# Patient Record
Sex: Female | Born: 1995
Health system: Southern US, Community
[De-identification: ages and names within clinical notes are randomized; demographics above are authoritative.]

## PROBLEM LIST (undated history)

## (undated) DIAGNOSIS — D649 Anemia, unspecified: Secondary | ICD-10-CM

## (undated) DIAGNOSIS — G4730 Sleep apnea, unspecified: Secondary | ICD-10-CM

## (undated) DIAGNOSIS — G47419 Narcolepsy without cataplexy: Secondary | ICD-10-CM

## (undated) HISTORY — DX: Anemia, unspecified: D64.9

## (undated) HISTORY — DX: Sleep apnea, unspecified: G47.30

---
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
## 2017-03-22 DIAGNOSIS — G47411 Narcolepsy with cataplexy: Secondary | ICD-10-CM | POA: Diagnosis not present

## 2017-03-22 DIAGNOSIS — Z79899 Other long term (current) drug therapy: Secondary | ICD-10-CM | POA: Diagnosis not present

## 2017-03-22 MED FILL — MODAFINIL 200 MG TABLET: 200 | 90 days supply | Qty: 180 | Fill #0

## 2017-06-07 DIAGNOSIS — G47411 Narcolepsy with cataplexy: Secondary | ICD-10-CM | POA: Diagnosis not present

## 2017-06-07 DIAGNOSIS — G4719 Other hypersomnia: Secondary | ICD-10-CM | POA: Diagnosis not present

## 2017-07-25 DIAGNOSIS — G4733 Obstructive sleep apnea (adult) (pediatric): Secondary | ICD-10-CM | POA: Diagnosis not present

## 2017-07-25 DIAGNOSIS — G4719 Other hypersomnia: Secondary | ICD-10-CM | POA: Diagnosis not present

## 2017-07-25 DIAGNOSIS — G47411 Narcolepsy with cataplexy: Secondary | ICD-10-CM | POA: Diagnosis not present

## 2017-10-08 ENCOUNTER — Telehealth: Payer: Self-pay | Admitting: Family

## 2017-10-08 NOTE — Telephone Encounter (Signed)
error 

## 2017-10-11 DIAGNOSIS — G4733 Obstructive sleep apnea (adult) (pediatric): Secondary | ICD-10-CM | POA: Diagnosis not present

## 2017-10-16 ENCOUNTER — Ambulatory Visit: Payer: 59 | Admitting: Family

## 2017-10-18 ENCOUNTER — Ambulatory Visit (INDEPENDENT_AMBULATORY_CARE_PROVIDER_SITE_OTHER): Payer: 59 | Admitting: Family

## 2017-10-18 ENCOUNTER — Encounter: Payer: Self-pay | Admitting: Family

## 2017-10-18 VITALS — BP 128/69 | HR 77 | Temp 98.5°F | Resp 16 | Ht 64.5 in | Wt 182.0 lb

## 2017-10-18 DIAGNOSIS — G4733 Obstructive sleep apnea (adult) (pediatric): Secondary | ICD-10-CM | POA: Diagnosis not present

## 2017-10-18 DIAGNOSIS — Z23 Encounter for immunization: Secondary | ICD-10-CM | POA: Diagnosis not present

## 2017-10-18 DIAGNOSIS — R0789 Other chest pain: Secondary | ICD-10-CM

## 2017-10-18 DIAGNOSIS — D649 Anemia, unspecified: Secondary | ICD-10-CM | POA: Diagnosis not present

## 2017-10-18 DIAGNOSIS — G47419 Narcolepsy without cataplexy: Secondary | ICD-10-CM | POA: Diagnosis not present

## 2017-10-18 DIAGNOSIS — G47411 Narcolepsy with cataplexy: Secondary | ICD-10-CM

## 2017-10-18 DIAGNOSIS — Z309 Encounter for contraceptive management, unspecified: Secondary | ICD-10-CM

## 2017-10-18 DIAGNOSIS — N898 Other specified noninflammatory disorders of vagina: Secondary | ICD-10-CM | POA: Diagnosis not present

## 2017-10-18 DIAGNOSIS — Z0000 Encounter for general adult medical examination without abnormal findings: Secondary | ICD-10-CM

## 2017-10-18 NOTE — Patient Instructions (Addendum)
Please schedule your routine eye exam.  Complete lab work prior to leaving. Work on [employer] Grouphealthy diet, exercise and weight loss.

## 2017-10-18 NOTE — Progress Notes (Signed)
Subjective:    Patient ID: Dorothy [name], female    DOB: 08/03/1996, 21 y.o.   MRN: [id number]  HPI  [name] is a 21 yr old female who presents today to establish care.  pmhx is significant for narcolepsy and anemia.  Narcolepsy- she is followed by Dorothy [name].  Last office visit reviewed in care everywhere.  Recently diagnosed with sleep apnea last Friday and will be fitted with cpap.    Anemia- She reports that she has not been taking iron recently.   Immunizations: due for MMR2, gardisil, tdap, varicella #2, meningococcal, flu Diet: reports tha she has been working on her diet Exercise: reports that she exercises 2 x a week generally.  Dental: up to date Vision:  Due  Wt Readings from Last 3 Encounters:  10/18/17 182 lb (82.6 kg) (95 %, Z= 1.67)*  09/09/14 142 lb (64.4 kg) (82 %, Z= 0.93)*  07/16/12 134 lb 12.8 oz (61.1 kg) (86 %, Z= 1.06)*   * Growth percentiles are based on CDC (Girls, 2-20 Years) data.   Reports that she has been having sensation of "pressure on my chest."  Reports that this is intermittent and it usually happens at rest.    Reports that she sometimes gets vaginal cysts.  They can be painful at times.  Believes she has 1 now.  Declines pelvic exam due to menses today.  Wishes to review with GYN.  Review of Systems  Constitutional: Negative for unexpected weight change.  HENT: Negative for rhinorrhea.   Respiratory: Negative for cough.   Cardiovascular:       Mom reports some LE swelling  Gastrointestinal: Negative for diarrhea.       Occasional mild constipation  Genitourinary: Positive for frequency. Negative for dysuria and menstrual problem.  Musculoskeletal: Negative for arthralgias and myalgias.  Skin:       Mild eczema bilateral hands  Neurological: Negative for headaches.  Hematological: Negative for adenopathy.  Psychiatric/Behavioral:       Denies depression/anxiety   Past Medical History:  Diagnosis Date  . Anemia     . Narcolepsy      Social History   Socioeconomic History  . Marital status: Single    Spouse name: Not on file  . Number of children: Not on file  . Years of education: Not on file  . Highest education level: Not on file  Social Needs  . Financial resource strain: Not on file  . Food insecurity - worry: Not on file  . Food insecurity - inability: Not on file  . Transportation needs - medical: Not on file  . Transportation needs - non-medical: Not on file  Occupational History  . Not on file  Tobacco Use  . Smoking status: Never Smoker  . Smokeless tobacco: Never Used  Substance and Sexual Activity  . Alcohol use: No  . Drug use: No  . Sexual activity: No  Other Topics Concern  . Not on file  Social History Narrative  . Not on file    History reviewed. No pertinent surgical history.  Family History  Problem Relation Age of Onset  . Learning disabilities Mother   . Diabetes Father   . Rheum arthritis Sister   . Diabetes Maternal Grandmother   . Stroke Maternal Grandmother   . Heart disease Maternal Grandfather   . Diabetes Paternal Grandmother   . Cancer Paternal Grandfather     No Known Allergies  Current Outpatient Medications on File Prior  to Visit  Medication Sig Dispense Refill  . modafinil (PROVIGIL) 200 MG tablet 1 tab by mouth in the AM and 1/2 to 1 tab by mouth at noon    . FLUOXETINE HCL PO Take by mouth.     No current facility-administered medications on file prior to visit.     BP 128/69 (BP Location: Right Arm, Patient Position: Sitting, Cuff Size: Small)   Pulse 77   Temp 98.5 F (36.9 C) (Oral)   Resp 16   Ht 5' 4.5" (1.638 m)   Wt 182 lb (82.6 kg)   LMP 09/18/2017   SpO2 100%   BMI 30.76 kg/m       Objective:   Physical Exam  Physical Exam  Constitutional: She is oriented to person, place, and time. She appears well-developed and well-nourished. No distress.  HENT:  Head: Normocephalic and atraumatic.  Right Ear: Tympanic  membrane and ear canal normal.  Left Ear: Tympanic membrane and ear canal normal.  Mouth/Throat: Oropharynx is clear and moist.  Eyes: Pupils are equal, round, and reactive to light. No scleral icterus.  Neck: Normal range of motion. No thyromegaly present.  Cardiovascular: Normal rate and regular rhythm.   No murmur heard. Pulmonary/Chest: Effort normal and breath sounds normal. No respiratory distress. He has no wheezes. She has no rales. She exhibits no tenderness.  Abdominal: Soft. Bowel sounds are normal. She exhibits no distension and no mass. There is no tenderness. There is no rebound and no guarding.  Musculoskeletal: She exhibits no edema.  Lymphadenopathy:    She has no cervical adenopathy.  Neurological: She is alert and oriented to person, place, and time. She has normal patellar reflexes. She exhibits normal muscle tone. Coordination normal.  Skin: Skin is warm and dry.  Psychiatric: She has a normal mood and affect. Her behavior is normal. Judgment and thought content normal.  Breast/pelvic: deferred to GYN          Assessment & Plan:  Preventative care-discussed healthy diet, exercise and weight loss.  Multiple vaccines were updated as  ordered.  When she returns for her second Gardasil vaccine will begin hepatitis A series as well.  Contraceptive management-she is maintained on modafinil.  Since this can interact with hormonal contraceptives will refer her to GYN to discuss alternatives.  In the meantime I have advised her to use condoms consistently.    Obstructive sleep apnea-this is a recent diagnosis. Also has narcolepsy.   She is in the process of getting CPAP arranged from her sleep specialist.  Anemia- check follow up CBC.    Vaginal lesions- defer to GYN at pt request.   Atypical chest pain- heart exam is normal. EKG tracing is personally reviewed.  EKG notes NSR.  No acute changes.  Could be related to her OSA. Will see how she does after she gets her cpap  started.            Assessment & Plan:

## 2017-10-19 ENCOUNTER — Encounter: Payer: Self-pay | Admitting: Family

## 2017-10-23 ENCOUNTER — Telehealth: Payer: Self-pay | Admitting: Family

## 2017-10-23 NOTE — Telephone Encounter (Signed)
It appears that she did not complete her lab work at the end of her visit.  Could you please contact the patient and ask her to schedule lab draw?

## 2017-10-23 NOTE — Telephone Encounter (Signed)
Pt lab apt has been scheduled.

## 2017-10-23 NOTE — Telephone Encounter (Signed)
Mychart message sent.

## 2017-10-24 ENCOUNTER — Other Ambulatory Visit (INDEPENDENT_AMBULATORY_CARE_PROVIDER_SITE_OTHER): Payer: 59

## 2017-10-24 DIAGNOSIS — Z0000 Encounter for general adult medical examination without abnormal findings: Secondary | ICD-10-CM

## 2017-10-24 LAB — URINALYSIS, ROUTINE W REFLEX MICROSCOPIC
Bilirubin Urine: NEGATIVE
Ketones, ur: NEGATIVE
Leukocytes, UA: NEGATIVE
Nitrite: NEGATIVE
SPECIFIC GRAVITY, URINE: 1.025 (ref 1.000–1.030)
Total Protein, Urine: NEGATIVE
Urine Glucose: NEGATIVE
Urobilinogen, UA: 0.2 (ref 0.0–1.0)
WBC, UA: NONE SEEN (ref 0–?)
pH: 6 (ref 5.0–8.0)

## 2017-10-24 LAB — CBC WITH DIFFERENTIAL/PLATELET
BASOS ABS: 0 10*3/uL (ref 0.0–0.1)
Basophils Relative: 0.3 % (ref 0.0–3.0)
EOS ABS: 0.1 10*3/uL (ref 0.0–0.7)
Eosinophils Relative: 2 % (ref 0.0–5.0)
HCT: 35.3 % — ABNORMAL LOW (ref 36.0–49.0)
HEMOGLOBIN: 11.3 g/dL — AB (ref 12.0–16.0)
LYMPHS ABS: 2.3 10*3/uL (ref 0.7–4.0)
Lymphocytes Relative: 45.5 % (ref 24.0–48.0)
MCHC: 32 g/dL (ref 31.0–37.0)
MCV: 88.8 fl (ref 78.0–98.0)
Monocytes Absolute: 0.3 10*3/uL (ref 0.1–1.0)
Monocytes Relative: 6.8 % (ref 3.0–12.0)
Neutro Abs: 2.3 10*3/uL (ref 1.4–7.7)
Neutrophils Relative %: 45.4 % (ref 43.0–71.0)
Platelets: 225 10*3/uL (ref 150.0–575.0)
RBC: 3.98 Mil/uL (ref 3.80–5.70)
RDW: 15.2 % (ref 11.4–15.5)
WBC: 5 10*3/uL (ref 4.5–13.5)

## 2017-10-24 LAB — BASIC METABOLIC PANEL
BUN: 11 mg/dL (ref 6–23)
CALCIUM: 9.6 mg/dL (ref 8.4–10.5)
CO2: 29 mEq/L (ref 19–32)
CREATININE: 0.74 mg/dL (ref 0.40–1.20)
Chloride: 102 mEq/L (ref 96–112)
GFR: 107.4 mL/min (ref >60.00)
Glucose, Bld: 88 mg/dL (ref 70–99)
POTASSIUM: 4.2 meq/L (ref 3.5–5.1)
Sodium: 140 mEq/L (ref 135–145)

## 2017-10-24 LAB — HEPATIC FUNCTION PANEL
ALT: 18 U/L (ref 0–35)
AST: 23 U/L (ref 0–37)
Albumin: 4.4 g/dL (ref 3.5–5.2)
Alkaline Phosphatase: 116 U/L (ref 47–119)
BILIRUBIN TOTAL: 0.4 mg/dL (ref 0.2–1.2)
Bilirubin, Direct: 0.1 mg/dL (ref 0.0–0.3)
TOTAL PROTEIN: 7.9 g/dL (ref 6.0–8.3)

## 2017-10-24 LAB — TSH: TSH: 2.21 u[IU]/mL (ref 0.40–5.00)

## 2017-10-24 LAB — LIPID PANEL
Cholesterol: 164 mg/dL (ref 0–200)
HDL: 49.3 mg/dL (ref >39.00)
LDL CALC: 100 mg/dL — AB (ref 0–99)
NonHDL: 114.6
TRIGLYCERIDES: 73 mg/dL (ref 0.0–149.0)
Total CHOL/HDL Ratio: 3
VLDL: 14.6 mg/dL (ref 0.0–40.0)

## 2017-10-25 ENCOUNTER — Other Ambulatory Visit: Payer: Self-pay | Admitting: Family

## 2017-10-25 MED ORDER — MULTI-VITAMIN/MINERALS PO TABS: 1.0000 | ORAL_TABLET | Freq: Every day | ORAL | Status: DC

## 2017-10-25 NOTE — Telephone Encounter (Signed)
Lab work shows mild anemia. Begin MVI with minerals. Some blood in urine but I think that she had her period that day- can you confirm please?

## 2017-10-28 NOTE — Telephone Encounter (Signed)
Last LMP documented 09/18/2017.

## 2017-11-01 ENCOUNTER — Encounter: Payer: Self-pay | Admitting: Obstetrics & Gynecology

## 2017-11-01 ENCOUNTER — Ambulatory Visit (INDEPENDENT_AMBULATORY_CARE_PROVIDER_SITE_OTHER): Payer: 59 | Admitting: Obstetrics & Gynecology

## 2017-11-01 VITALS — BP 126/59 | HR 87 | Ht 64.0 in | Wt 183.0 lb

## 2017-11-01 DIAGNOSIS — Z23 Encounter for immunization: Secondary | ICD-10-CM

## 2017-11-01 DIAGNOSIS — N946 Dysmenorrhea, unspecified: Secondary | ICD-10-CM | POA: Insufficient documentation

## 2017-11-01 DIAGNOSIS — D649 Anemia, unspecified: Secondary | ICD-10-CM | POA: Insufficient documentation

## 2017-11-01 DIAGNOSIS — Z0000 Encounter for general adult medical examination without abnormal findings: Secondary | ICD-10-CM | POA: Diagnosis not present

## 2017-11-01 DIAGNOSIS — D500 Iron deficiency anemia secondary to blood loss (chronic): Secondary | ICD-10-CM | POA: Diagnosis not present

## 2017-11-01 MED ORDER — NORGESTREL-ETHINYL ESTRADIOL 0.3-30 MG-MCG PO TABS: 1.0000 | ORAL_TABLET | Freq: Every day | ORAL | 11 refills | Status: DC

## 2017-11-01 MED FILL — ELINEST-28 TABLET: 0.3-30 | 84 days supply | Qty: 84 | Fill #0

## 2017-11-01 NOTE — Addendum Note (Signed)
Addended by: Anell BarrHOWARD, Magnolia Mattila L on: 11/01/2017 11:25 AM   Modules accepted: Orders

## 2017-11-01 NOTE — Progress Notes (Signed)
Patient ID: Dorothy [name], female   DOB: 11/24/1995, 21 y.o.   MRN: [id number]  No chief complaint on file.   HPI Dorothy [name] is a 21 y.o. female.single G0. Here today to discuss contraception, at her mother's request.She had coitarche at 21 yo. She has only had 1 partner. They had sex last sometime this past Spring. She is not currently sexually active. Her periods last 6-7 days, some cramping- takes IBU 400 mg prn.  She takes meds for narcolepsy.   HPI  Past Medical History:  Diagnosis Date  . Anemia   . Narcolepsy     History reviewed. No pertinent surgical history.  Family History  Problem Relation Age of Onset  . Learning disabilities Mother   . Diabetes Father   . Rheum arthritis Sister   . Diabetes Maternal Grandmother   . Stroke Maternal Grandmother   . Heart disease Maternal Grandfather   . Diabetes Paternal Grandmother   . Cancer Paternal Grandfather     Social History Social History   Tobacco Use  . Smoking status: Never Smoker  . Smokeless tobacco: Never Used  Substance Use Topics  . Alcohol use: No  . Drug use: No    No Known Allergies  Current Outpatient Medications  Medication Sig Dispense Refill  . modafinil (PROVIGIL) 200 MG tablet 1 tab by mouth in the AM and 1/2 to 1 tab by mouth at noon    . Multiple Vitamins-Minerals (MULTIVITAMIN WITH MINERALS) tablet Take 1 tablet by mouth daily.     No current facility-administered medications for this visit.     Review of Systems Review of Systems Graduated from high school, [facility] [employer] [facility]. She has had a flu vaccine this season.  Blood pressure (!) 126/59, pulse 87, height 5\' 4"  (1.626 m), weight 183 lb (83 kg), last menstrual period 10/18/2017.  Physical Exam Physical Exam Breathing, conversing, and ambulating normally Well nourished, well hydrated Black female, no apparent distress Abd- benign  Data Reviewed Results for Dorothy [name], Dorothy [name] (MRN [id number]) as of  11/01/2017 10:29  Ref. Range 10/24/2017 08:26  WBC Latest Ref Range: 4.5 - 13.5 K/uL 5.0  RBC Latest Ref Range: 3.80 - 5.70 Mil/uL 3.98  Hemoglobin Latest Ref Range: 12.0 - 16.0 g/dL 08.611.3 (L)  HCT Latest Ref Range: 36.0 - 49.0 % 35.3 (L)  MCV Latest Ref Range: 78.0 - 98.0 fl 88.8  MCHC Latest Ref Range: 31.0 - 37.0 g/dL 57.832.0  RDW Latest Ref Range: 11.4 - 15.5 % 15.2  Platelets Latest Ref Range: 150.0 - 575.0 K/uL 225.0    Assessment    Dysmenorrhea and low grade anemia-  lo ovral prescribed. With her Provigil, the efficacy for contraception may be decreased. She will use condoms EVERY SINGLE SEX ENCOUNTER!  Check STI testing today    Gardasil #1 today  Plan    See above She declines IUD, depo provera, Nexplanon Come back 2 months for Gardasil #2       Allie BossierMyra C Prakash Kimberling 11/01/2017, 10:20 AM

## 2017-11-02 LAB — RPR: RPR: NONREACTIVE

## 2017-11-02 LAB — HEPATITIS B SURFACE ANTIGEN: HEP B S AG: NEGATIVE

## 2017-11-02 LAB — HEPATITIS C ANTIBODY: Hep C Virus Ab: <0.1 s/co ratio (ref 0.0–0.9)

## 2017-11-02 LAB — HIV ANTIBODY (ROUTINE TESTING W REFLEX): HIV Screen 4th Generation wRfx: NONREACTIVE

## 2017-11-03 ENCOUNTER — Encounter: Payer: Self-pay | Admitting: Obstetrics & Gynecology

## 2017-11-07 ENCOUNTER — Other Ambulatory Visit: Payer: Self-pay

## 2017-11-07 DIAGNOSIS — G4733 Obstructive sleep apnea (adult) (pediatric): Secondary | ICD-10-CM | POA: Diagnosis not present

## 2017-11-07 MED ORDER — NORGESTREL-ETHINYL ESTRADIOL 0.3-30 MG-MCG PO TABS: 1.0000 | ORAL_TABLET | Freq: Every day | ORAL | 11 refills | Status: DC

## 2017-11-07 NOTE — Telephone Encounter (Signed)
Patient changing pharmacies.Armandina StammerJennifer Howard RNBSN

## 2017-11-18 MED FILL — MODAFINIL 200 MG TABLET: 200 | 90 days supply | Qty: 180 | Fill #0

## 2017-11-22 ENCOUNTER — Ambulatory Visit: Payer: 59

## 2017-11-27 ENCOUNTER — Encounter: Payer: Self-pay | Admitting: Obstetrics & Gynecology

## 2017-12-02 ENCOUNTER — Encounter: Payer: Self-pay | Admitting: Obstetrics & Gynecology

## 2017-12-08 DIAGNOSIS — G4733 Obstructive sleep apnea (adult) (pediatric): Secondary | ICD-10-CM | POA: Diagnosis not present

## 2017-12-09 DIAGNOSIS — G4733 Obstructive sleep apnea (adult) (pediatric): Secondary | ICD-10-CM | POA: Diagnosis not present

## 2017-12-16 ENCOUNTER — Encounter: Payer: Self-pay | Admitting: Obstetrics & Gynecology

## 2017-12-23 ENCOUNTER — Ambulatory Visit: Payer: 59 | Admitting: Obstetrics & Gynecology

## 2018-01-03 ENCOUNTER — Other Ambulatory Visit: Payer: 59

## 2018-01-05 DIAGNOSIS — G4733 Obstructive sleep apnea (adult) (pediatric): Secondary | ICD-10-CM | POA: Diagnosis not present

## 2018-01-08 ENCOUNTER — Ambulatory Visit (INDEPENDENT_AMBULATORY_CARE_PROVIDER_SITE_OTHER): Payer: 59 | Admitting: Obstetrics & Gynecology

## 2018-01-08 ENCOUNTER — Encounter: Payer: Self-pay | Admitting: Obstetrics & Gynecology

## 2018-01-08 VITALS — BP 118/65 | HR 89 | Ht 64.0 in | Wt 172.0 lb

## 2018-01-08 DIAGNOSIS — N946 Dysmenorrhea, unspecified: Secondary | ICD-10-CM

## 2018-01-08 LAB — CBC
HCT: 34.3 % — ABNORMAL LOW (ref 35.0–45.0)
Hemoglobin: 11.3 g/dL — ABNORMAL LOW (ref 11.7–15.5)
MCH: 28 pg (ref 27.0–33.0)
MCHC: 32.9 g/dL (ref 32.0–36.0)
MCV: 84.9 fL (ref 80.0–100.0)
MPV: 11.2 fL (ref 7.5–12.5)
Platelets: 279 10*3/uL (ref 140–400)
RBC: 4.04 10*6/uL (ref 3.80–5.10)
RDW: 13.3 % (ref 11.0–15.0)
WBC: 4.5 10*3/uL (ref 3.8–10.8)

## 2018-01-08 NOTE — Progress Notes (Signed)
   Subjective:    Patient ID: Dorothy LeveringKahli D Capp, female    DOB: 04/02/1996, 22 y.o.   MRN: [id number]  HPI 22 yo single G0 here to discuss her new start of Lo ovral. She has had 1 full period and bled every day for a month, heavy sometimes heav   Review of Systems She has now had 2 Gardasil shots    Objective:   Physical Exam  Breathing, conversing, and ambulating normally Well nourished, well hydrated Black female, no apparent distress Abd- benign TSH- normal 12/18     Assessment & Plan:  Anemia- recheck hemoglobin Check for Von Willebrands Come back 3 months for Gardasil #3

## 2018-01-09 LAB — VON WILLEBRAND PANEL

## 2018-01-10 ENCOUNTER — Telehealth: Payer: Self-pay

## 2018-01-10 DIAGNOSIS — Z9989 Dependence on other enabling machines and devices: Secondary | ICD-10-CM | POA: Diagnosis not present

## 2018-01-10 DIAGNOSIS — G4733 Obstructive sleep apnea (adult) (pediatric): Secondary | ICD-10-CM | POA: Diagnosis not present

## 2018-01-10 NOTE — Telephone Encounter (Signed)
We got notification that pt's Von Willebrand Panel was not resulted by [facility] due to the specimen not being frozen when it was picked up. Called pt to see if she can come back to office to have it redrawn and she put her mother on the phone. Pt's mother spoke with me and stated that pt was going back to school and could not come back to the office other than today after 12:00 but, our office closed at 12:00. We printed off an order for pt to pick up and recommended she go to [facility] to have it drawn.

## 2018-01-10 NOTE — Addendum Note (Signed)
Addended by: Granville LewisLARK, Ovid Witman L on: 01/10/2018 11:39 AM   Modules accepted: Orders

## 2018-02-05 DIAGNOSIS — G4733 Obstructive sleep apnea (adult) (pediatric): Secondary | ICD-10-CM | POA: Diagnosis not present

## 2018-04-18 MED FILL — MODAFINIL 200 MG TABLET: 200 | 30 days supply | Qty: 60 | Fill #0

## 2018-06-20 DIAGNOSIS — G4733 Obstructive sleep apnea (adult) (pediatric): Secondary | ICD-10-CM | POA: Diagnosis not present

## 2018-06-25 MED FILL — MODAFINIL 200 MG TABLET: 200 | 30 days supply | Qty: 60 | Fill #0

## 2018-08-07 MED FILL — MODAFINIL 200 MG TABLET: 200 | 30 days supply | Qty: 60 | Fill #1

## 2018-10-01 MED FILL — MODAFINIL 200 MG TABLET: 200 | 30 days supply | Qty: 60 | Fill #2

## 2018-11-21 DIAGNOSIS — G4733 Obstructive sleep apnea (adult) (pediatric): Secondary | ICD-10-CM | POA: Diagnosis not present

## 2018-12-05 DIAGNOSIS — G4733 Obstructive sleep apnea (adult) (pediatric): Secondary | ICD-10-CM | POA: Diagnosis not present

## 2018-12-05 DIAGNOSIS — G47411 Narcolepsy with cataplexy: Secondary | ICD-10-CM | POA: Diagnosis not present

## 2018-12-05 DIAGNOSIS — Z9989 Dependence on other enabling machines and devices: Secondary | ICD-10-CM | POA: Diagnosis not present

## 2018-12-15 MED FILL — MODAFINIL 200 MG TABLET: 200 | 30 days supply | Qty: 60 | Fill #0

## 2019-01-05 DIAGNOSIS — G47411 Narcolepsy with cataplexy: Secondary | ICD-10-CM | POA: Diagnosis not present

## 2019-03-13 MED FILL — MODAFINIL 200 MG TABS: 200 | 30 days supply | Qty: 60 | Fill #0

## 2019-03-20 DIAGNOSIS — G4733 Obstructive sleep apnea (adult) (pediatric): Secondary | ICD-10-CM | POA: Diagnosis not present

## 2019-06-30 ENCOUNTER — Other Ambulatory Visit: Payer: Self-pay

## 2019-06-30 ENCOUNTER — Ambulatory Visit (INDEPENDENT_AMBULATORY_CARE_PROVIDER_SITE_OTHER): Payer: 59 | Admitting: Family

## 2019-06-30 DIAGNOSIS — R1084 Generalized abdominal pain: Secondary | ICD-10-CM

## 2019-06-30 NOTE — Progress Notes (Signed)
Virtual Visit via Video Note  I connected with Dorothy [name] on 06/30/19 at  5:00 PM EDT by a video enabled telemedicine application and verified that I am speaking with the correct person using two identifiers.  Location: Patient: home Provider: work   I discussed the limitations of evaluation and management by telemedicine and the availability of in person appointments. The patient expressed understanding and agreed to proceed.  History of Present Illness:  Patient is a 23 yr old female who presents today with chief complaint of abdominal pain reports that abdominal pain is intermittent and not really localized.   Pain has been on/off for 2 weeks. Reports associated nausea without vomiting. Reports that she has been having regular bowel movements.   LMP was 7/29- 8/2. Reports that she took pregnancy test 8/19 and it was negative. Did have unprotected sex around 06/09/19.  Past Medical History:  Diagnosis Date  . Anemia   . Narcolepsy      Social History   Socioeconomic History  . Marital status: Single    Spouse name: Not on file  . Number of children: Not on file  . Years of education: Not on file  . Highest education level: Not on file  Occupational History  . Not on file  Social Needs  . Financial resource strain: Not on file  . Food insecurity    Worry: Not on file    Inability: Not on file  . Transportation needs    Medical: Not on file    Non-medical: Not on file  Tobacco Use  . Smoking status: Never Smoker  . Smokeless tobacco: Never Used  Substance and Sexual Activity  . Alcohol use: No  . Drug use: No  . Sexual activity: Not Currently    Birth control/protection: Pill  Lifestyle  . Physical activity    Days per week: Not on file    Minutes per session: Not on file  . Stress: Not on file  Relationships  . Social Herbalist on phone: Not on file    Gets together: Not on file    Attends religious service: Not on file    Active member of  club or organization: Not on file    Attends meetings of clubs or organizations: Not on file    Relationship status: Not on file  . Intimate partner violence    Fear of current or ex partner: Not on file    Emotionally abused: Not on file    Physically abused: Not on file    Forced sexual activity: Not on file  Other Topics Concern  . Not on file  Social History Narrative   Lives with mom and her younger sister   Will begin at [employer]- wants to do forensic psychology   Enjoys reading, music, color, spend time with family      No past surgical history on file.  Family History  Problem Relation Age of Onset  . Learning disabilities Mother   . Diabetes Father   . Rheum arthritis Sister   . Diabetes Maternal Grandmother   . Stroke Maternal Grandmother   . Heart disease Maternal Grandfather   . Diabetes Paternal Grandmother   . Cancer Paternal Grandfather     No Known Allergies  Current Outpatient Medications on File Prior to Visit  Medication Sig Dispense Refill  . modafinil (PROVIGIL) 200 MG tablet 1 tab by mouth in the AM and 1/2 to 1 tab by mouth at noon    .  Multiple Vitamins-Minerals (MULTIVITAMIN WITH MINERALS) tablet Take 1 tablet by mouth daily.     No current facility-administered medications on file prior to visit.     There were no vitals taken for this visit.      Observations/Objective:   Gen: Awake, alert, no acute distress Resp: Breathing is even and non-labored Psych: calm/pleasant demeanor Neuro: Alert and Oriented x 3, + facial symmetry, speech is clear. Abdominal: Subjective tenderness to palpation in all 4 quadrants  Assessment and Plan:  Abdominal pain- I would like to bring her  Into the office first thing tomorrow AM and do a thorough exam as well as repeat her pregnancy test.  She is advised to go to the ER overnight if severe/worsening symptoms. Pt verbalizes understanding.    Follow Up Instructions:    I discussed the  assessment and treatment plan with the patient. The patient was provided an opportunity to ask questions and all were answered. The patient agreed with the plan and demonstrated an understanding of the instructions.   The patient was advised to call back or seek an in-person evaluation if the symptoms worsen or if the condition fails to improve as anticipated.  Lemont FillersMelissa S O'Sullivan, NP

## 2019-07-01 ENCOUNTER — Other Ambulatory Visit: Payer: Self-pay

## 2019-07-01 ENCOUNTER — Encounter: Payer: 59 | Admitting: Family

## 2019-07-01 ENCOUNTER — Ambulatory Visit: Payer: 59 | Admitting: Family

## 2019-07-01 ENCOUNTER — Ambulatory Visit (HOSPITAL_BASED_OUTPATIENT_CLINIC_OR_DEPARTMENT_OTHER)
Admission: RE | Admit: 2019-07-01 | Discharge: 2019-07-01 | Disposition: A | Discharge status: Home / Self Care | Payer: 59 | Source: Ambulatory Visit | Attending: Family | Admitting: Family

## 2019-07-01 ENCOUNTER — Telehealth: Payer: Self-pay | Admitting: Family

## 2019-07-01 ENCOUNTER — Other Ambulatory Visit (HOSPITAL_COMMUNITY)
Admission: RE | Admit: 2019-07-01 | Discharge: 2019-07-01 | Disposition: A | Discharge status: Home / Self Care | Payer: 59 | Source: Ambulatory Visit | Attending: Family | Admitting: Family

## 2019-07-01 ENCOUNTER — Encounter: Payer: Self-pay | Admitting: Family

## 2019-07-01 VITALS — BP 122/74 | HR 93 | Temp 97.4°F | Resp 16 | Ht 66.0 in | Wt 188.2 lb

## 2019-07-01 DIAGNOSIS — R809 Proteinuria, unspecified: Secondary | ICD-10-CM

## 2019-07-01 DIAGNOSIS — Z01419 Encounter for gynecological examination (general) (routine) without abnormal findings: Secondary | ICD-10-CM

## 2019-07-01 DIAGNOSIS — R1084 Generalized abdominal pain: Secondary | ICD-10-CM

## 2019-07-01 DIAGNOSIS — R350 Frequency of micturition: Secondary | ICD-10-CM | POA: Diagnosis not present

## 2019-07-01 LAB — URINALYSIS, ROUTINE W REFLEX MICROSCOPIC
Bilirubin Urine: NEGATIVE
Hgb urine dipstick: NEGATIVE
Ketones, ur: NEGATIVE
Leukocytes,Ua: NEGATIVE
Nitrite: NEGATIVE
Specific Gravity, Urine: 1.02 (ref 1.000–1.030)
Total Protein, Urine: NEGATIVE
Urine Glucose: NEGATIVE
Urobilinogen, UA: 0.2 (ref 0.0–1.0)
pH: 6 (ref 5.0–8.0)

## 2019-07-01 LAB — CBC WITH DIFFERENTIAL/PLATELET
Basophils Absolute: 0 10*3/uL (ref 0.0–0.1)
Basophils Relative: 0.4 % (ref 0.0–3.0)
Eosinophils Absolute: 0 10*3/uL (ref 0.0–0.7)
Eosinophils Relative: 1.1 % (ref 0.0–5.0)
HCT: 34.4 % — ABNORMAL LOW (ref 36.0–46.0)
Hemoglobin: 11.4 g/dL — ABNORMAL LOW (ref 12.0–15.0)
Lymphocytes Relative: 41.4 % (ref 12.0–46.0)
Lymphs Abs: 1.7 10*3/uL (ref 0.7–4.0)
MCHC: 33.1 g/dL (ref 30.0–36.0)
MCV: 86.6 fl (ref 78.0–100.0)
Monocytes Absolute: 0.3 10*3/uL (ref 0.1–1.0)
Monocytes Relative: 6.7 % (ref 3.0–12.0)
Neutro Abs: 2 10*3/uL (ref 1.4–7.7)
Neutrophils Relative %: 50.4 % (ref 43.0–77.0)
Platelets: 235 10*3/uL (ref 150.0–400.0)
RBC: 3.98 Mil/uL (ref 3.87–5.11)
RDW: 14.6 % (ref 11.5–14.6)
WBC: 4.1 10*3/uL — ABNORMAL LOW (ref 4.5–10.5)

## 2019-07-01 LAB — COMPREHENSIVE METABOLIC PANEL
ALT: 12 U/L (ref 0–35)
AST: 18 U/L (ref 0–37)
Albumin: 4.5 g/dL (ref 3.5–5.2)
Alkaline Phosphatase: 93 U/L (ref 39–117)
BUN: 7 mg/dL (ref 6–23)
CO2: 25 mEq/L (ref 19–32)
Calcium: 9.5 mg/dL (ref 8.4–10.5)
Chloride: 103 mEq/L (ref 96–112)
Creatinine, Ser: 0.73 mg/dL (ref 0.40–1.20)
GFR: 122.09 mL/min (ref >60.00)
Glucose, Bld: 98 mg/dL (ref 70–99)
Potassium: 3.7 mEq/L (ref 3.5–5.1)
Sodium: 136 mEq/L (ref 135–145)
Total Bilirubin: 0.3 mg/dL (ref 0.2–1.2)
Total Protein: 7.8 g/dL (ref 6.0–8.3)

## 2019-07-01 LAB — POC URINALSYSI DIPSTICK (AUTOMATED)
Bilirubin, UA: NEGATIVE
Blood, UA: NEGATIVE
Glucose, UA: NEGATIVE
Ketones, UA: NEGATIVE
Leukocytes, UA: NEGATIVE
Nitrite, UA: NEGATIVE
Protein, UA: POSITIVE — AB
Spec Grav, UA: 1.02 (ref 1.010–1.025)
Urobilinogen, UA: 0.2 E.U./dL
pH, UA: 6 (ref 5.0–8.0)

## 2019-07-01 LAB — LIPASE: Lipase: 20 U/L (ref 11.0–59.0)

## 2019-07-01 LAB — POCT URINE PREGNANCY: Preg Test, Ur: NEGATIVE

## 2019-07-01 NOTE — Progress Notes (Signed)
Subjective:    Patient ID: Dorothy [name], female    DOB: 09/20/1996, 22 y.o.   MRN: [id number]  HPI  Patient is a 23 yr old female who presents today for further evaluation of her complaint of abdominal pain and nausea which has been intermittent x 2 weeks.  We saw her last night for a virtual visit. Today she reports that her abdominal discomfort is somewhat improved.  Reports that menstrual cycle is supposed to start today.  She reports that she had some urinary frequency x 2 weeks.  She reports that her she had brief nausea last night without vomiting. Denies fever or back pain.  She denies vaginal itching. Has has some white discharge.    Review of Systems See HPI  Past Medical History:  Diagnosis Date  . Anemia   . Narcolepsy      Social History   Socioeconomic History  . Marital status: Single    Spouse name: Not on file  . Number of children: Not on file  . Years of education: Not on file  . Highest education level: Not on file  Occupational History  . Not on file  Social Needs  . Financial resource strain: Not on file  . Food insecurity    Worry: Not on file    Inability: Not on file  . Transportation needs    Medical: Not on file    Non-medical: Not on file  Tobacco Use  . Smoking status: Never Smoker  . Smokeless tobacco: Never Used  Substance and Sexual Activity  . Alcohol use: No  . Drug use: No  . Sexual activity: Not Currently    Birth control/protection: Pill  Lifestyle  . Physical activity    Days per week: Not on file    Minutes per session: Not on file  . Stress: Not on file  Relationships  . Social Musicianconnections    Talks on phone: Not on file    Gets together: Not on file    Attends religious service: Not on file    Active member of club or organization: Not on file    Attends meetings of clubs or organizations: Not on file    Relationship status: Not on file  . Intimate partner violence    Fear of current or ex partner: Not on file     Emotionally abused: Not on file    Physically abused: Not on file    Forced sexual activity: Not on file  Other Topics Concern  . Not on file  Social History Narrative   Lives with mom and her younger sister   [facility] [employer]- wants to do forensic psychology   Enjoys reading, music, color, spend time with family      No past surgical history on file.  Family History  Problem Relation Age of Onset  . Learning disabilities Mother   . Diabetes Father   . Rheum arthritis Sister   . Diabetes Maternal Grandmother   . Stroke Maternal Grandmother   . Heart disease Maternal Grandfather   . Diabetes Paternal Grandmother   . Cancer Paternal Grandfather     No Known Allergies  Current Outpatient Medications on File Prior to Visit  Medication Sig Dispense Refill  . modafinil (PROVIGIL) 200 MG tablet 1 tab by mouth in the AM and 1/2 to 1 tab by mouth at noon    . Multiple Vitamins-Minerals (MULTIVITAMIN WITH MINERALS) tablet Take 1 tablet by mouth daily.  No current facility-administered medications on file prior to visit.     BP 122/74 (BP Location: Right Arm, Patient Position: Sitting, Cuff Size: Small)   Pulse 93   Temp (!) 97.4 F (36.3 C) (Temporal)   Resp 16   Ht 5\' 6"  (1.676 m)   Wt 188 lb 3.2 oz (85.4 kg)   SpO2 99%   BMI 30.38 kg/m       Objective:   Physical Exam Constitutional:      Appearance: She is well-developed.  Neck:     Musculoskeletal: Neck supple.     Thyroid: No thyromegaly.  Cardiovascular:     Rate and Rhythm: Normal rate and regular rhythm.     Heart sounds: Normal heart sounds. No murmur.  Pulmonary:     Effort: Pulmonary effort is normal. No respiratory distress.     Breath sounds: Normal breath sounds. No wheezing.  Abdominal:     General: Bowel sounds are normal.     Tenderness: There is abdominal tenderness in the epigastric area, suprapubic area and left upper quadrant. There is no right CVA tenderness, left CVA  tenderness, guarding or rebound.  Skin:    General: Skin is warm and dry.  Neurological:     Mental Status: She is alert and oriented to person, place, and time.  Psychiatric:        Behavior: Behavior normal.        Thought Content: Thought content normal.        Judgment: Judgment normal.   Pelvic: normal cervix, no CMT or adnexal tenderness.  No unusual vaginal discharge noted        Assessment & Plan:  Abdominal pain- ? Constipation, ? Resolving gastroenteritis.  HCG is negative. Overall symptoms are improving. Recommend KUB,stat cmet, cbc, lipase.  Urinalysis is reviewed and not suggestive of UTI.   It does note proteinuria (? False positive) will send specimen off to the lab to repeat/confirm protein.   Pap is performed today and we will also send for GC/Chlamydia/trich screening.

## 2019-07-01 NOTE — Patient Instructions (Signed)
Please complete lab work prior to leaving. Complete x-ray on the first floor. Call if symptoms worsen or if symptoms do not continue to improve. Go to ER if you develop severe abdominal pain or if you are unable to keep down food/liquid.

## 2019-07-01 NOTE — Telephone Encounter (Signed)
Blood work so far looks good, some labs still pending.  X-ray shows moderate constipation. I would recommend that she increase water and fresh fruits/veggies. I would also recommend that she take 1 capful of mirlax mixed in water or juice today to help with her constipation. Call if abdominal discomfort does not continue to improve.

## 2019-07-02 LAB — CYTOLOGY - PAP
Chlamydia: NEGATIVE
Diagnosis: NEGATIVE
Neisseria Gonorrhea: NEGATIVE
Trichomonas: NEGATIVE

## 2019-07-02 NOTE — Telephone Encounter (Signed)
All results given to patient with provider's instructions. She verbalizes understanding and will call if symptoms persist or get worst

## 2019-07-03 ENCOUNTER — Encounter: Payer: Self-pay | Admitting: Family

## 2019-08-18 ENCOUNTER — Encounter: Payer: 59 | Admitting: Family

## 2019-09-01 ENCOUNTER — Emergency Department (HOSPITAL_BASED_OUTPATIENT_CLINIC_OR_DEPARTMENT_OTHER): Payer: 59

## 2019-09-01 ENCOUNTER — Telehealth (INDEPENDENT_AMBULATORY_CARE_PROVIDER_SITE_OTHER): Payer: 59 | Admitting: Family

## 2019-09-01 ENCOUNTER — Other Ambulatory Visit: Payer: Self-pay

## 2019-09-01 ENCOUNTER — Encounter (HOSPITAL_BASED_OUTPATIENT_CLINIC_OR_DEPARTMENT_OTHER): Payer: Self-pay | Admitting: *Deleted

## 2019-09-01 ENCOUNTER — Emergency Department (HOSPITAL_BASED_OUTPATIENT_CLINIC_OR_DEPARTMENT_OTHER)
Admission: EM | Admit: 2019-09-01 | Discharge: 2019-09-01 | Disposition: A | Discharge status: Home / Self Care | Payer: 59 | Attending: Emergency Medicine | Admitting: Emergency Medicine

## 2019-09-01 DIAGNOSIS — R112 Nausea with vomiting, unspecified: Secondary | ICD-10-CM | POA: Diagnosis not present

## 2019-09-01 DIAGNOSIS — R1033 Periumbilical pain: Secondary | ICD-10-CM

## 2019-09-01 DIAGNOSIS — R1084 Generalized abdominal pain: Secondary | ICD-10-CM | POA: Diagnosis not present

## 2019-09-01 DIAGNOSIS — Z79899 Other long term (current) drug therapy: Secondary | ICD-10-CM | POA: Insufficient documentation

## 2019-09-01 DIAGNOSIS — R109 Unspecified abdominal pain: Secondary | ICD-10-CM | POA: Diagnosis not present

## 2019-09-01 LAB — PREGNANCY, URINE: Preg Test, Ur: NEGATIVE

## 2019-09-01 LAB — CBC WITH DIFFERENTIAL/PLATELET
Abs Immature Granulocytes: 0.01 10*3/uL (ref 0.00–0.07)
Basophils Absolute: 0 10*3/uL (ref 0.0–0.1)
Basophils Relative: 0 %
Eosinophils Absolute: 0.1 10*3/uL (ref 0.0–0.5)
Eosinophils Relative: 2 %
HCT: 39.5 % (ref 36.0–46.0)
Hemoglobin: 12.6 g/dL (ref 12.0–15.0)
Immature Granulocytes: 0 %
Lymphocytes Relative: 54 %
Lymphs Abs: 2.9 10*3/uL (ref 0.7–4.0)
MCH: 28.4 pg (ref 26.0–34.0)
MCHC: 31.9 g/dL (ref 30.0–36.0)
MCV: 89 fL (ref 80.0–100.0)
Monocytes Absolute: 0.3 10*3/uL (ref 0.1–1.0)
Monocytes Relative: 6 %
Neutro Abs: 2 10*3/uL (ref 1.7–7.7)
Neutrophils Relative %: 38 %
Platelets: 231 10*3/uL (ref 150–400)
RBC: 4.44 MIL/uL (ref 3.87–5.11)
RDW: 14 % (ref 11.5–15.5)
WBC: 5.4 10*3/uL (ref 4.0–10.5)
nRBC: 0 % (ref 0.0–0.2)

## 2019-09-01 LAB — COMPREHENSIVE METABOLIC PANEL
ALT: 33 U/L (ref 0–44)
AST: 36 U/L (ref 15–41)
Albumin: 4.7 g/dL (ref 3.5–5.0)
Alkaline Phosphatase: 115 U/L (ref 38–126)
Anion gap: 10 (ref 5–15)
BUN: 7 mg/dL (ref 6–20)
CO2: 25 mmol/L (ref 22–32)
Calcium: 9.9 mg/dL (ref 8.9–10.3)
Chloride: 101 mmol/L (ref 98–111)
Creatinine, Ser: 0.73 mg/dL (ref 0.44–1.00)
GFR calc Af Amer: >60 mL/min (ref >60)
GFR calc non Af Amer: >60 mL/min (ref >60)
Glucose, Bld: 96 mg/dL (ref 70–99)
Potassium: 3.9 mmol/L (ref 3.5–5.1)
Sodium: 136 mmol/L (ref 135–145)
Total Bilirubin: 0.5 mg/dL (ref 0.3–1.2)
Total Protein: 8.5 g/dL — ABNORMAL HIGH (ref 6.5–8.1)

## 2019-09-01 LAB — URINALYSIS, ROUTINE W REFLEX MICROSCOPIC
Bilirubin Urine: NEGATIVE
Glucose, UA: NEGATIVE mg/dL
Hgb urine dipstick: NEGATIVE
Ketones, ur: NEGATIVE mg/dL
Nitrite: NEGATIVE
Protein, ur: NEGATIVE mg/dL
Specific Gravity, Urine: <1.005 — ABNORMAL LOW (ref 1.005–1.030)
pH: 5.5 (ref 5.0–8.0)

## 2019-09-01 LAB — URINALYSIS, MICROSCOPIC (REFLEX)

## 2019-09-01 LAB — LIPASE, BLOOD: Lipase: 25 U/L (ref 11–51)

## 2019-09-01 MED ORDER — SODIUM CHLORIDE 0.9 % IV BOLUS
1000.0000 mL | Freq: Once | INTRAVENOUS | Status: AC
  Administered 2019-09-01: 1000 mL via INTRAVENOUS

## 2019-09-01 MED ORDER — FAMOTIDINE IN NACL 20-0.9 MG/50ML-% IV SOLN
20.0000 mg | Freq: Once | INTRAVENOUS | Status: AC
  Administered 2019-09-01: 21:00:00 20 mg via INTRAVENOUS
  Filled 2019-09-01: qty 50

## 2019-09-01 MED ORDER — ONDANSETRON HCL 4 MG/2ML IJ SOLN
4.0000 mg | Freq: Once | INTRAMUSCULAR | Status: AC
  Administered 2019-09-01: 4 mg via INTRAVENOUS
  Filled 2019-09-01: qty 2

## 2019-09-01 MED ORDER — FAMOTIDINE 20 MG PO TABS: 20.0000 mg | ORAL_TABLET | Freq: Two times a day (BID) | ORAL | 0 refills | Status: DC

## 2019-09-01 MED ORDER — ONDANSETRON 4 MG PO TBDP: ORAL_TABLET | ORAL | 0 refills | Status: DC

## 2019-09-01 MED ORDER — IOHEXOL 300 MG/ML  SOLN
100.0000 mL | Freq: Once | INTRAMUSCULAR | Status: AC | PRN
  Administered 2019-09-01: 100 mL via INTRAVENOUS

## 2019-09-01 MED ORDER — DICYCLOMINE HCL 20 MG PO TABS: 20.0000 mg | ORAL_TABLET | Freq: Two times a day (BID) | ORAL | 0 refills | Status: DC

## 2019-09-01 MED ORDER — DICYCLOMINE HCL 10 MG/ML IM SOLN
20.0000 mg | Freq: Once | INTRAMUSCULAR | Status: AC
  Administered 2019-09-01: 20 mg via INTRAMUSCULAR
  Filled 2019-09-01: qty 2

## 2019-09-01 NOTE — Progress Notes (Signed)
Virtual Visit via Video Note  I connected with Patrina Levering on 09/01/19 at  5:20 PM EDT by a video enabled telemedicine application and verified that I am speaking with the correct person using two identifiers.  Location: Patient: home Provider: work   I discussed the limitations of evaluation and management by telemedicine and the availability of in person appointments. The patient expressed understanding and agreed to proceed.  History of Present Illness:  Patient is a 23 year old female who presents today for virtual visit to discuss abdominal pain.  She initially saw [place] back in August with reports of abdominal discomfort. Reports that this happens most days.  "all over her abdomen." has feeling of esatiety.  At that time we obtained a urinalysis which did not indicate urinary tract infection.  Lipase and LFTs and renal function were normal.  She underwent a KUB which noted moderate stool volume.  Since that time she reports intermittent abdominal discomfort.  Typically it is a cramping discomfort but she sometimes gets sharp pains in the periumbilical area.  She reports that her abdominal pain today is 7 out of 10 in severity.  Reports some nausea, was happening every day for 1 month. Has only vomited x 1 about 1 month ago.  LMP 10/17  Past Medical History:  Diagnosis Date  . Anemia   . Narcolepsy      Social History   Socioeconomic History  . Marital status: Single    Spouse name: Not on file  . Number of children: Not on file  . Years of education: Not on file  . Highest education level: Not on file  Occupational History  . Not on file  Social Needs  . Financial resource strain: Not on file  . Food insecurity    Worry: Not on file    Inability: Not on file  . Transportation needs    Medical: Not on file    Non-medical: Not on file  Tobacco Use  . Smoking status: Never Smoker  . Smokeless tobacco: Never Used  Substance and Sexual Activity  . Alcohol use: No  .  Drug use: No  . Sexual activity: Not Currently    Birth control/protection: Pill  Lifestyle  . Physical activity    Days per week: Not on file    Minutes per session: Not on file  . Stress: Not on file  Relationships  . Social Musician on phone: Not on file    Gets together: Not on file    Attends religious service: Not on file    Active member of club or organization: Not on file    Attends meetings of clubs or organizations: Not on file    Relationship status: Not on file  . Intimate partner violence    Fear of current or ex partner: Not on file    Emotionally abused: Not on file    Physically abused: Not on file    Forced sexual activity: Not on file  Other Topics Concern  . Not on file  Social History Narrative   Lives with mom and her younger sister   Will begin at [employer]- wants to do forensic psychology   Enjoys reading, music, color, spend time with family      No past surgical history on file.  Family History  Problem Relation Age of Onset  . Learning disabilities Mother   . Diabetes Father   . Rheum arthritis Sister   . Diabetes Maternal Grandmother   .  Stroke Maternal Grandmother   . Heart disease Maternal Grandfather   . Diabetes Paternal Grandmother   . Cancer Paternal Grandfather     No Known Allergies  Current Outpatient Medications on File Prior to Visit  Medication Sig Dispense Refill  . modafinil (PROVIGIL) 200 MG tablet 1 tab by mouth in the AM and 1/2 to 1 tab by mouth at noon    . Multiple Vitamins-Minerals (MULTIVITAMIN WITH MINERALS) tablet Take 1 tablet by mouth daily.     No current facility-administered medications on file prior to visit.     There were no vitals taken for this visit.    Observations/Objective:   Gen: Awake, alert, no acute distress Resp: Breathing is even and non-labored Psych: calm/pleasant demeanor Neuro: Alert and Oriented x 3, + facial symmetry, speech is clear.   Assessment and  Plan:  Abdominal pain- due to the periumbilical pain I recommended that she proceed to the emergency department for further evaluation.  We discussed the possibility of appendicitis.  It is 5:30 PM and unfortunately I am unable to arrange an outpatient CT at this hour.  Patient was instructed to go to the emergency department at the med center [facility] location.  This is close to her house and she is familiar with the location. Report was given to Dr. Billy Fischer EDP at [facility].    Follow Up Instructions:    I discussed the assessment and treatment plan with the patient. The patient was provided an opportunity to ask questions and all were answered. The patient agreed with the plan and demonstrated an understanding of the instructions.   The patient was advised to call back or seek an in-person evaluation if the symptoms worsen or if the condition fails to improve as anticipated.  Nance Pear, NP

## 2019-09-01 NOTE — ED Provider Notes (Signed)
[facility] EMERGENCY DEPARTMENT Provider Note   CSN: [id number] Arrival date & time: 09/01/19  1806     History   Chief Complaint Chief Complaint  Patient presents with  . Abdominal Pain    HPI Dorothy [name] is a 23 y.o. female.     Dorothy [name] is a 23 y.o. female with a history of anemia and narcolepsy, and who presents to the ED for evaluation of abdominal pain.  Patient reports she has been experiencing generalized abdominal pains over the past 2 months and has been following with her PCP, Dr. Inda Castle regarding this pain.  Patient had telemedicine visit due to worsening pain over the past week which she currently localizes to the epigastric region, and was sent to the ED for evaluation of possible appendicitis given periumbilical pain worsening.  Patient reports that over the past 2 weeks she has experienced generalized abdominal pain most days, one episode of vomiting when pains for started since then she has had intermittent nausea.  Was experiencing constipation and was initially started on MiraLAX but after taking a few doses discontinued this medication, she does report that it was initially helping.  She has not been taking any other regular medications to treat the symptoms.  Initially had lab work done with her PCP about a month ago when symptoms started and this was reassuring.  Patient reports that over the past week pain has become more severe and more persistent she reports it is frequently in the periumbilical region but she also reports pain over bilateral sides of the abdomen and continues to have generalized pain intermittently.  She has not had any nausea or vomiting.  No fevers or chills.  Reports she frequently deals with constipation and has not had any diarrhea.  No blood in the stool.  Dysuria or urinary frequency.  No vaginal bleeding or discharge.  No chest pain, shortness of breath or cough.  No other aggravating or alleviating factors.      Past Medical History:  Diagnosis Date  . Anemia   . Narcolepsy     Patient Active Problem List   Diagnosis Date Noted  . Anemia 11/01/2017  . Dysmenorrhea in adolescent 11/01/2017  . Fatigue 04/17/2012    History reviewed. No pertinent surgical history.   OB History    Gravida  0   Para  0   Term  0   Preterm  0   AB  0   Living  0     SAB  0   TAB  0   Ectopic  0   Multiple  0   Live Births  0            Home Medications    Prior to Admission medications   Medication Sig Start Date End Date Taking? Authorizing Provider  dicyclomine (BENTYL) 20 MG tablet Take 1 tablet (20 mg total) by mouth 2 (two) times daily. 09/01/19   Jacqlyn Larsen, PA-C  famotidine (PEPCID) 20 MG tablet Take 1 tablet (20 mg total) by mouth 2 (two) times daily. 09/01/19   Jacqlyn Larsen, PA-C  modafinil (PROVIGIL) 200 MG tablet 1 tab by mouth in the AM and 1/2 to 1 tab by mouth at noon 10/07/17   [provider]  Multiple Vitamins-Minerals (MULTIVITAMIN WITH MINERALS) tablet Take 1 tablet by mouth daily. 10/25/17   Debbrah Alar, NP  ondansetron (ZOFRAN ODT) 4 MG disintegrating tablet 4mg  ODT q4 hours prn nausea/vomit 09/01/19  Dartha Lodge, PA-C    Family History Family History  Problem Relation Age of Onset  . Learning disabilities Mother   . Diabetes Father   . Rheum arthritis Sister   . Diabetes Maternal Grandmother   . Stroke Maternal Grandmother   . Heart disease Maternal Grandfather   . Diabetes Paternal Grandmother   . Cancer Paternal Grandfather     Social History Social History   Tobacco Use  . Smoking status: Never Smoker  . Smokeless tobacco: Never Used  Substance Use Topics  . Alcohol use: No  . Drug use: No     Allergies   Patient has no known allergies.   Review of Systems Review of Systems  Constitutional: Negative for chills and fever.  HENT: Negative.   Respiratory: Negative for cough and shortness of breath.    Cardiovascular: Negative for chest pain.  Gastrointestinal: Positive for abdominal pain and constipation. Negative for blood in stool, diarrhea, nausea and vomiting.  Genitourinary: Negative for dysuria, flank pain, frequency, pelvic pain, vaginal bleeding and vaginal discharge.  Musculoskeletal: Negative for arthralgias and myalgias.  Skin: Negative for color change and rash.  Neurological: Negative for dizziness, syncope and light-headedness.     Physical Exam Updated Vital Signs BP 117/70   Pulse 73   Temp 98.6 F (37 C)   Resp 16   Ht  (1.626 m)   Wt 81.6 kg   LMP 08/18/2019   SpO2 100%   BMI 30.90 kg/m   Physical Exam Vitals signs and nursing note reviewed.  Constitutional:      General: She is not in acute distress.    Appearance: She is well-developed and normal weight. She is not ill-appearing or diaphoretic.  HENT:     Head: Normocephalic and atraumatic.     Mouth/Throat:     Mouth: Mucous membranes are moist.     Pharynx: Oropharynx is clear.  Eyes:     General:        Right eye: No discharge.        Left eye: No discharge.     Pupils: Pupils are equal, round, and reactive to light.  Neck:     Musculoskeletal: Neck supple.  Cardiovascular:     Rate and Rhythm: Normal rate and regular rhythm.     Heart sounds: Normal heart sounds. No murmur. No friction rub. No gallop.   Pulmonary:     Effort: Pulmonary effort is normal. No respiratory distress.     Breath sounds: Normal breath sounds. No wheezing or rales.     Comments: Respirations equal and unlabored, patient able to speak in full sentences, lungs clear to auscultation bilaterally Abdominal:     General: Bowel sounds are normal. There is no distension.     Palpations: Abdomen is soft. There is no mass.     Tenderness: There is generalized abdominal tenderness. There is no guarding.     Comments: Abdomen is soft and nondistended, bowel sounds present throughout, there is generalized tenderness  throughout the abdomen without one area of focal severe pain.  No guarding, no peritoneal signs.  No CVA tenderness.  Musculoskeletal:        General: No deformity.  Skin:    General: Skin is warm and dry.     Capillary Refill: Capillary refill takes less than 2 seconds.  Neurological:     Mental Status: She is alert and oriented to person, place, and time.     Coordination: Coordination normal.  Comments: Speech is clear, able to follow commands Moves extremities without ataxia, coordination intact  Psychiatric:        Mood and Affect: Mood normal.        Behavior: Behavior normal.      ED Treatments / Results  Labs (all labs ordered are listed, but only abnormal results are displayed) Labs Reviewed  URINALYSIS, ROUTINE W REFLEX MICROSCOPIC - Abnormal; Notable for the following components:      Result Value   Specific Gravity, Urine <1.005 (*)    Leukocytes,Ua TRACE (*)    All other components within normal limits  URINALYSIS, MICROSCOPIC (REFLEX) - Abnormal; Notable for the following components:   Bacteria, UA FEW (*)    All other components within normal limits  COMPREHENSIVE METABOLIC PANEL - Abnormal; Notable for the following components:   Total Protein 8.5 (*)    All other components within normal limits  PREGNANCY, URINE  CBC WITH DIFFERENTIAL/PLATELET  LIPASE, BLOOD    EKG None  Radiology Ct Abdomen Pelvis W Contrast  Result Date: 09/01/2019 CLINICAL DATA:  Abdominal pain for 2 months EXAM: CT ABDOMEN AND PELVIS WITH CONTRAST TECHNIQUE: Multidetector CT imaging of the abdomen and pelvis was performed using the standard protocol following bolus administration of intravenous contrast. CONTRAST:  100mL OMNIPAQUE IOHEXOL 300 MG/ML  SOLN COMPARISON:  None. FINDINGS: Lower chest: No acute abnormality noted. Hepatobiliary: Gallbladder is decompressed. The liver demonstrates a geographic area of decreased attenuation within the posterior aspect of the lateral segment  of the left lobe of the liver. This likely represents focal fatty infiltration. No other focal mass is seen. Pancreas: Unremarkable. No pancreatic ductal dilatation or surrounding inflammatory changes. Spleen: Normal in size without focal abnormality. Adrenals/Urinary Tract: Adrenal glands are within normal limits. Kidneys are well visualized bilaterally. No renal calculi or obstructive changes are seen. The bladder is partially distended. Stomach/Bowel: The appendix is within normal limits. No obstructive or inflammatory changes of the colon are noted. Small bowel is within normal limits. Stomach is unremarkable. Vascular/Lymphatic: No significant vascular findings are present. No enlarged abdominal or pelvic lymph nodes. Reproductive: Uterus and bilateral adnexa are unremarkable. Other: No abdominal wall hernia or abnormality. No abdominopelvic ascites. Musculoskeletal: No acute or significant osseous findings. IMPRESSION: Geographic area of decreased attenuation in the posterior aspect of the lateral segment of the left lobe of the liver. This likely represents focal fatty infiltration. No other focal abnormality is seen. Electronically Signed   By: Alcide CleverMark  Lukens M.D.   On: 09/01/2019 21:20    Procedures Procedures (including critical care time)  Medications Ordered in ED Medications  sodium chloride 0.9 % bolus 1,000 mL ( Intravenous Stopped 09/01/19 2149)  ondansetron (ZOFRAN) injection 4 mg (4 mg Intravenous Given 09/01/19 2047)  dicyclomine (BENTYL) injection 20 mg (20 mg Intramuscular Given 09/01/19 2047)  famotidine (PEPCID) IVPB 20 mg premix (0 mg Intravenous Stopped 09/01/19 2151)  iohexol (OMNIPAQUE) 300 MG/ML solution 100 mL (100 mLs Intravenous Contrast Given 09/01/19 2102)     Initial Impression / Assessment and Plan / ED Course  I have reviewed the triage vital signs and the nursing notes.  Pertinent labs & imaging results that were available during my care of the patient were  reviewed by me and considered in my medical decision making (see chart for details).  23 year old female presents with 2 months of generalized abdominal pain worsening over the past week.  Sent in by PCP after telemedicine visit with concern for possible appendicitis given worsening pain.  On arrival she has normal vitals and is well-appearing.  No associated fevers, vomiting, urinary symptoms.  She has had intermittent constipation.  No blood in the stools.  No vaginal bleeding or discharge.  On exam she has generalized abdominal pain that does not localize to any 1 quadrant.  Given persistence of pain will go ahead with abdominal labs and CT abdomen pelvis.  IV fluids, Bentyl, Pepcid and Zofran given for symptom management.  Patient with no leukocytosis and normal hemoglobin, no acute electrolyte derangements, normal renal and liver function and normal lipase.  Negative pregnancy, urinalysis without signs of infection.  CT abdomen pelvis with some signs of fatty liver infiltration, but no other acute abnormalities to explain pain.  On reevaluation patient has had improvement with the medications given here today.  Given generalized pain without focal area of discomfort I think the patient may be experiencing gastritis but I also think constipation is likely playing a role.  Will have patient restart MiraLAX and take consistently we will also prescribe Bentyl, Pepcid and Zofran.  In addition to following up with PCP I have also recommended patient see GI given persistent pain.  Return precautions discussed.  Patient and mom expressed understanding and agreement with plan.  Discharged home in good condition.  Final Clinical Impressions(s) / ED Diagnoses   Final diagnoses:  Generalized abdominal pain    ED Discharge Orders         Ordered    dicyclomine (BENTYL) 20 MG tablet  2 times daily     09/01/19 2238    famotidine (PEPCID) 20 MG tablet  2 times daily     09/01/19 2238    ondansetron (ZOFRAN  ODT) 4 MG disintegrating tablet     09/01/19 2238           Jodi Geralds [place], [place] 09/03/19 1854    Alvira Monday, MD 09/05/19 1225

## 2019-09-01 NOTE — ED Triage Notes (Signed)
Pt c/o abd pain x 2 months , sent here by PMD for eval

## 2019-09-01 NOTE — Discharge Instructions (Signed)
Your lab work and CT scan today is overall reassuring, aside from some fatty liver infiltration I see no abnormalities on scan.  We will continue to treat your abdominal pain symptomatically with prescribed Bentyl for abdominal cramping, Pepcid to help reduce acid production, Zofran as needed for nausea and vomiting.  I would also like for you to use daily MiraLAX to help regulate your bowel movements.  You can also take a daily probiotic supplement.  Please follow-up with Dr. Inda Castle regarding this continued pain and I think you would also benefit from following up with a GI specialist.  Return to the ED for new or worsening symptoms.

## 2019-09-02 MED FILL — FAMOTIDINE 20 MG TABS: 20 | 15 days supply | Qty: 30 | Fill #0

## 2019-09-02 MED FILL — ONDANSETRON ODT 4 MG TABLET: 4 | 2 days supply | Qty: 10 | Fill #0

## 2019-09-02 MED FILL — DICYCLOMINE 20 MG TABLET: 20 | 10 days supply | Qty: 20 | Fill #0

## 2019-09-16 ENCOUNTER — Encounter: Payer: Self-pay | Admitting: Family

## 2019-09-16 ENCOUNTER — Other Ambulatory Visit: Payer: Self-pay

## 2019-09-16 ENCOUNTER — Telehealth (INDEPENDENT_AMBULATORY_CARE_PROVIDER_SITE_OTHER): Payer: 59 | Admitting: Family

## 2019-09-16 VITALS — Temp 98.0°F | Wt 182.0 lb

## 2019-09-16 DIAGNOSIS — R109 Unspecified abdominal pain: Secondary | ICD-10-CM | POA: Diagnosis not present

## 2019-09-16 NOTE — Progress Notes (Signed)
Virtual Visit via Video Note  I connected with Dorothy [name] on 09/16/19 at 11:20 AM EST by a video enabled telemedicine application and verified that I am speaking with the correct person using two identifiers.  Location: Patient: home Provider: home    I discussed the limitations of evaluation and management by telemedicine and the availability of in person appointments. The patient expressed understanding and agreed to proceed.  History of Present Illness:  Patient is a 23 yr old female who presents today for ED follow up.  We initially saw her for periumbilical pain via video visit on 09/01/2019.  We sent her to the emergency department for further evaluation and to rule out appendicitis.  ED record is reviewed.  CT of the abdomen and pelvis was unremarkable with the exception of a an area of fatty liver.   Since returning home from the ED she reports that she has some abdominal cramping at night.  Nausea "comes and goes."  2-3 days ago she had some dry heaves.  Reports some foods cause increased upset.  Sometimes greasy foods seem to worsen her symptoms  Past Medical History:  Diagnosis Date  . Anemia   . Narcolepsy      Social History   Socioeconomic History  . Marital status: Single    Spouse name: Not on file  . Number of children: Not on file  . Years of education: Not on file  . Highest education level: Not on file  Occupational History  . Not on file  Social Needs  . Financial resource strain: Not on file  . Food insecurity    Worry: Not on file    Inability: Not on file  . Transportation needs    Medical: Not on file    Non-medical: Not on file  Tobacco Use  . Smoking status: Never Smoker  . Smokeless tobacco: Never Used  Substance and Sexual Activity  . Alcohol use: No  . Drug use: No  . Sexual activity: Not Currently    Birth control/protection: None  Lifestyle  . Physical activity    Days per week: Not on file    Minutes per session: Not on file   . Stress: Not on file  Relationships  . Social Herbalist on phone: Not on file    Gets together: Not on file    Attends religious service: Not on file    Active member of club or organization: Not on file    Attends meetings of clubs or organizations: Not on file    Relationship status: Not on file  . Intimate partner violence    Fear of current or ex partner: Not on file    Emotionally abused: Not on file    Physically abused: Not on file    Forced sexual activity: Not on file  Other Topics Concern  . Not on file  Social History Narrative   Lives with mom and her younger sister   Will begin at [employer]- wants to do forensic psychology   Enjoys reading, music, color, spend time with family      No past surgical history on file.  Family History  Problem Relation Age of Onset  . Learning disabilities Mother   . Diabetes Father   . Rheum arthritis Sister   . Diabetes Maternal Grandmother   . Stroke Maternal Grandmother   . Heart disease Maternal Grandfather   . Diabetes Paternal Grandmother   . Cancer Paternal Grandfather  No Known Allergies  Current Outpatient Medications on File Prior to Visit  Medication Sig Dispense Refill  . dicyclomine (BENTYL) 20 MG tablet Take 1 tablet (20 mg total) by mouth 2 (two) times daily. 20 tablet 0  . famotidine (PEPCID) 20 MG tablet Take 1 tablet (20 mg total) by mouth 2 (two) times daily. 30 tablet 0  . modafinil (PROVIGIL) 200 MG tablet 1 tab by mouth in the AM and 1/2 to 1 tab by mouth at noon    . Multiple Vitamins-Minerals (MULTIVITAMIN WITH MINERALS) tablet Take 1 tablet by mouth daily.    . ondansetron (ZOFRAN ODT) 4 MG disintegrating tablet 4mg  ODT q4 hours prn nausea/vomit 10 tablet 0   No current facility-administered medications on file prior to visit.     Temp 98 F (36.7 C) (Oral)   Wt 182 lb (82.6 kg)   LMP 08/18/2019 Comment: neg preg test  BMI 31.24 kg/m        Observations/Objective:   Gen: Awake, alert, no acute distress Resp: Breathing is even and non-labored Psych: calm/pleasant demeanor Neuro: Alert and Oriented x 3, + facial symmetry, speech is clear.   Assessment and Plan:  Abdominal Pain- will refer to GI for further evaluation.  IBS is a consideration.  I do not see that she had any gall stones or Gallbladder wall thickening on CT.  Gastritis and ulcer are also considerations.  She understands that should she develop severe recurrent abdominal pain she should return to the emergency   Follow Up Instructions:    I discussed the assessment and treatment plan with the patient. The patient was provided an opportunity to ask questions and all were answered. The patient agreed with the plan and demonstrated an understanding of the instructions.   The patient was advised to call back or seek an in-person evaluation if the symptoms worsen or if the condition fails to improve as anticipated.  08/20/2019, NP

## 2019-09-17 ENCOUNTER — Encounter: Payer: Self-pay | Admitting: Gastroenterology

## 2019-10-06 ENCOUNTER — Ambulatory Visit: Payer: 59 | Admitting: Gastroenterology

## 2019-11-04 ENCOUNTER — Encounter: Payer: Self-pay | Admitting: Gastroenterology

## 2019-11-04 ENCOUNTER — Other Ambulatory Visit: Payer: Self-pay

## 2019-11-04 ENCOUNTER — Ambulatory Visit: Payer: 59 | Admitting: Gastroenterology

## 2019-11-04 VITALS — BP 114/74 | HR 92 | Temp 99.5°F | Ht 64.0 in | Wt 196.4 lb

## 2019-11-04 DIAGNOSIS — R1013 Epigastric pain: Secondary | ICD-10-CM

## 2019-11-04 DIAGNOSIS — K5900 Constipation, unspecified: Secondary | ICD-10-CM

## 2019-11-04 DIAGNOSIS — R112 Nausea with vomiting, unspecified: Secondary | ICD-10-CM

## 2019-11-04 DIAGNOSIS — Z1159 Encounter for screening for other viral diseases: Secondary | ICD-10-CM

## 2019-11-04 NOTE — Progress Notes (Signed)
Chief Complaint: Abdominal pain, nausea/vomiting, constipation Referring Provider:     Sandford Craze, NP   HPI:    Dorothy [name] is a 23 y.o. female referred to the [facility]ology Clinic for evaluation of abdominal pain along with nausea and vomiting.  She states she has had intermittent abdominal pain for the last 4-5 months with associated nausea and episodic non-bloody emesis/dry heaves. Points to periumbilical and MEG as most common site of pain. No radiation. Was daily for a number of months, now 2-3 days/week. Sxs can last throughout the day. Worse with fried foods, fast food. No hematochezia, melena, hematemesis.   Some improvement recently due to dietary modifications and returning home from school 2/2 Covid restrictions.   Does report constipation during this same time period, treated with MiraLAX prn with improvement when she takes it. Has not trialed fiber supplement, but did increase dietary fiber.  Denies THC, illicit drug use, rare EtOH.  Was seen in the ER for lower abdominal pain on 09/01/2019, described as generalized abdominal pain worsening over 2 weeks.  No nausea/vomiting at that time.  Evaluation largely unrevealing to include normal CBC, CMP, lipase, hCG, UA.  CT with area of focal fatty liver infiltration, but otherwise normal.  Improved with Zofran, Bentyl, Pepcid and discharged home with MiraLAX, Bentyl, Pepcid, Zofran.  No longer taking Rx.  Was seen in follow-up by her PCM on 09/16/2019.  Ongoing abdominal cramping along with nausea at that time and referred to GI.  No prior EGD/colonoscopy.   PGF with unknown type of abdominal CA, o/w no known family history of CRC, GI malignancy, liver disease, pancreatic disease, or IBD.   Past Medical History:  Diagnosis Date  . Anemia   . Narcolepsy   . Sleep apnea    Uses a CPAP      History reviewed. No pertinent surgical history. Family History  Problem Relation Age of Onset  . Learning  disabilities Mother   . Diabetes Father   . Rheum arthritis Sister   . Diabetes Maternal Grandmother   . Stroke Maternal Grandmother   . Heart disease Maternal Grandfather   . Diabetes Paternal Grandmother   . Cancer Paternal Grandfather        unknown   Social History   Tobacco Use  . Smoking status: Never Smoker  . Smokeless tobacco: Never Used  Substance Use Topics  . Alcohol use: Yes  . Drug use: No   Current Outpatient Medications  Medication Sig Dispense Refill  . dicyclomine (BENTYL) 20 MG tablet Take 1 tablet (20 mg total) by mouth 2 (two) times daily. 20 tablet 0  . famotidine (PEPCID) 20 MG tablet Take 1 tablet (20 mg total) by mouth 2 (two) times daily. 30 tablet 0  . modafinil (PROVIGIL) 200 MG tablet 1 tab by mouth in the AM and 1/2 to 1 tab by mouth at noon    . Multiple Vitamins-Minerals (MULTIVITAMIN WITH MINERALS) tablet Take 1 tablet by mouth daily.    . ondansetron (ZOFRAN ODT) 4 MG disintegrating tablet 4mg  ODT q4 hours prn nausea/vomit 10 tablet 0   No current facility-administered medications for this visit.   No Known Allergies   Review of Systems: All systems reviewed and negative except where noted in HPI.     Physical Exam:    Wt Readings from Last 3 Encounters:  11/04/19 196 lb 6 oz (89.1 kg)  09/16/19 182 lb (82.6 kg)  09/01/19 180 lb (81.6 kg)    BP 114/74   Pulse 92   Temp 99.5 F (37.5 C)   Ht 5\' 4"  (1.626 m)   Wt 196 lb 6 oz (89.1 kg)   BMI 33.71 kg/m  Constitutional:  Pleasant, in no acute distress. Psychiatric: Normal mood and affect. Behavior is normal. EENT: Pupils normal.  Conjunctivae are normal. No scleral icterus. Neck supple. No cervical LAD. Cardiovascular: Normal rate, regular rhythm. No edema Pulmonary/chest: Effort normal and breath sounds normal. No wheezing, rales or rhonchi. Abdominal: Mild TTP in upper abdomen w/o rebound or guarding. no peritoneal signs. Soft, nondistender. Bowel sounds active throughout.  There are no masses palpable. No hepatomegaly. Neurological: Alert and oriented to person place and time. Skin: Skin is warm and dry. No rashes noted.   ASSESSMENT AND PLAN;   1) Upper abdominal pain/epigastric pain 2) Nausea/vomiting 3) Constipation/Change in bowel habits  - EGD with gastric and duodenal bxs - Start Miralax daily and titrate to soft stools without straining to have BM -Suspect some element of IBS-C, potentially 2/2 stress related to school, Covid, etc. - Low FODMAP diet - Fiber chart -When having regular bowel habits, plan to wean from MiraLAX and add fiber supplement and titrate to maintain regular stools -Holding off on acid suppression therapy pending endoscopic eval  The indications, risks, and benefits of EGD were explained to the patient in detail. Risks include but are not limited to bleeding, perforation, adverse reaction to medications, and cardiopulmonary compromise. Sequelae include but are not limited to the possibility of surgery, hositalization, and mortality. The patient verbalized understanding and wished to proceed. All questions answered, referred to scheduler. Further recommendations pending results of the exam.   Lavena Bullion, DO, FACG  11/04/2019, 10:39 AM   Debbrah Alar, NP

## 2019-11-04 NOTE — Patient Instructions (Signed)
Low FODMAP Diet: (Fermentable Oligosaccharides, Disaccharides, Monosaccharides, and Polyols) These are short chain carbohydrates and sugar alcohols that are poorly absorbed by the body, resulting in multiple abdominal symptoms, including changes in bowel habits, abdominal pain/discomfort, bloating, abdominal distension, gas, etc.       Please start taking a daily fiber supplement such as citrucel, benefiber, metamucil, or fiber choice.    Please purchase the following medications over the counter and take as directed: Miralax  You have been scheduled for an endoscopy. Please follow written instructions given to you at your visit today. If you use inhalers (even only as needed), please bring them with you on the day of your procedure. Your physician has requested that you go to www.startemmi.com and enter the access code given to you at your visit today. This web site gives a general overview about your procedure. However, you should still follow specific instructions given to you by our office regarding your preparation for the procedure.

## 2019-11-09 ENCOUNTER — Encounter: Payer: Self-pay | Admitting: Gastroenterology

## 2019-11-12 ENCOUNTER — Other Ambulatory Visit: Payer: Self-pay | Admitting: Gastroenterology

## 2019-11-12 ENCOUNTER — Ambulatory Visit (INDEPENDENT_AMBULATORY_CARE_PROVIDER_SITE_OTHER): Payer: 59

## 2019-11-12 DIAGNOSIS — Z1159 Encounter for screening for other viral diseases: Secondary | ICD-10-CM | POA: Diagnosis not present

## 2019-11-13 LAB — SARS CORONAVIRUS 2 (TAT 6-24 HRS): SARS Coronavirus 2: NEGATIVE

## 2019-11-16 ENCOUNTER — Other Ambulatory Visit: Payer: Self-pay

## 2019-11-16 ENCOUNTER — Encounter: Payer: Self-pay | Admitting: Gastroenterology

## 2019-11-16 ENCOUNTER — Ambulatory Visit (AMBULATORY_SURGERY_CENTER): Payer: 59 | Admitting: Gastroenterology

## 2019-11-16 VITALS — BP 116/77 | HR 67 | Temp 98.3°F | Resp 14 | Ht 64.0 in | Wt 196.0 lb

## 2019-11-16 DIAGNOSIS — R110 Nausea: Secondary | ICD-10-CM | POA: Diagnosis not present

## 2019-11-16 DIAGNOSIS — R112 Nausea with vomiting, unspecified: Secondary | ICD-10-CM

## 2019-11-16 DIAGNOSIS — K2950 Unspecified chronic gastritis without bleeding: Secondary | ICD-10-CM

## 2019-11-16 DIAGNOSIS — R1013 Epigastric pain: Secondary | ICD-10-CM

## 2019-11-16 MED ORDER — SODIUM CHLORIDE 0.9 % IV SOLN: 500.0000 mL | Freq: Once | INTRAVENOUS | Status: DC

## 2019-11-16 NOTE — Progress Notes (Signed)
Pt's states no medical or surgical changes since previsit or office visit.  Temp JR VS CW  

## 2019-11-16 NOTE — Patient Instructions (Signed)
YOU HAD AN ENDOSCOPIC PROCEDURE TODAY AT THE [facility]:   Refer to the procedure report that was given to you for any specific questions about what was found during the examination.  If the procedure report does not answer your questions, please call your gastroenterologist to clarify.  If you requested that your care partner not be given the details of your procedure findings, then the procedure report has been included in a sealed envelope for you to review at your convenience later.  YOU SHOULD EXPECT: Some feelings of bloating in the abdomen. Passage of more gas than usual.  Walking can help get rid of the air that was put into your GI tract during the procedure and reduce the bloating. If you had a lower endoscopy (such as a colonoscopy or flexible sigmoidoscopy) you may notice spotting of blood in your stool or on the toilet paper. If you underwent a bowel prep for your procedure, you may not have a normal bowel movement for a few days.  Please Note:  You might notice some irritation and congestion in your nose or some drainage.  This is from the oxygen used during your procedure.  There is no need for concern and it should clear up in a day or so.  SYMPTOMS TO REPORT IMMEDIATELY:   Following upper endoscopy (EGD)  Vomiting of blood or coffee ground material  New chest pain or pain under the shoulder blades  Painful or persistently difficult swallowing  New shortness of breath  Fever of 100F or higher  Black, tarry-looking stools  For urgent or emergent issues, a gastroenterologist can be reached at any hour by calling [phone].   DIET:  We do recommend a small meal at first, but then you may proceed to your regular diet.  Drink plenty of fluids but you should avoid alcoholic beverages for 24 hours.  ACTIVITY:  You should plan to take it easy for the rest of today and you should NOT DRIVE or use heavy machinery until tomorrow (because of the sedation medicines used  during the test).    FOLLOW UP: Our staff will call the number listed on your records 48-72 hours following your procedure to check on you and address any questions or concerns that you may have regarding the information given to you following your procedure. If we do not reach you, we will leave a message.  We will attempt to reach you two times.  During this call, we will ask if you have developed any symptoms of COVID 19. If you develop any symptoms (ie: fever, flu-like symptoms, shortness of breath, cough etc.) before then, please call [phone].  If you test positive for Covid 19 in the 2 weeks post procedure, please call and report this information to us.    If any biopsies were taken you will be contacted by phone or by letter within the next 1-3 weeks.  Please call us at [phone] if you have not heard about the biopsies in 3 weeks.    SIGNATURES/CONFIDENTIALITY: You and/or your care partner have signed paperwork which will be entered into your electronic medical record.  These signatures attest to the fact that that the information above on your After Visit Summary has been reviewed and is understood.  Full responsibility of the confidentiality of this discharge information lies with you and/or your care-partner. 

## 2019-11-16 NOTE — Progress Notes (Signed)
Called to room to assist during endoscopic procedure.  Patient ID and intended procedure confirmed with present staff. Received instructions for my participation in the procedure from the performing physician.  

## 2019-11-16 NOTE — Op Note (Signed)
[facility] Patient Name: Dorothy [name] Procedure Date: 11/16/2019 10:43 AM MRN: [id number] Endoscopist: Doristine Locks , MD Age: 24 Referring MD:  Date of Birth: June 05, 1996 Gender: Female Account #: [id number] Procedure:                Upper GI endoscopy Indications:              Epigastric abdominal pain, Periumbilical abdominal                            pain, Nausea with vomiting Medicines:                Monitored Anesthesia Care Procedure:                Pre-Anesthesia Assessment:                           - Prior to the procedure, a History and Physical                            was performed, and patient medications and                            allergies were reviewed. The patient's tolerance of                            previous anesthesia was also reviewed. The risks                            and benefits of the procedure and the sedation                            options and risks were discussed with the patient.                            All questions were answered, and informed consent                            was obtained. Prior Anticoagulants: The patient has                            taken no previous anticoagulant or antiplatelet                            agents. ASA Grade Assessment: II - A patient with                            mild systemic disease. After reviewing the risks                            and benefits, the patient was deemed in                            satisfactory condition to undergo the procedure.  After obtaining informed consent, the endoscope was                            passed under direct vision. Throughout the                            procedure, the patient's blood pressure, pulse, and                            oxygen saturations were monitored continuously. The                            Endoscope was introduced through the mouth, and                            advanced to the second  part of duodenum. The upper                            GI endoscopy was accomplished without difficulty.                            The patient tolerated the procedure well. Scope In: Scope Out: Findings:                 The examined esophagus was normal.                           The Z-line was regular and was found 38 cm from the                            incisors.                           The entire examined stomach was normal. Biopsies                            were taken with a cold forceps for Helicobacter                            pylori testing. Estimated blood loss was minimal.                           The duodenal bulb, first portion of the duodenum                            and second portion of the duodenum were normal.                            Biopsies were taken with a cold forceps for                            histology. Estimated blood loss was minimal. Complications:            No immediate complications. Estimated Blood Loss:     Estimated blood loss was  minimal. Impression:               - Normal esophagus.                           - Z-line regular, 38 cm from the incisors.                           - Normal stomach. Biopsied.                           - Normal duodenal bulb, first portion of the                            duodenum and second portion of the duodenum.                            Biopsied. Recommendation:           - Patient has a contact number available for                            emergencies. The signs and symptoms of potential                            delayed complications were discussed with the                            patient. Return to normal activities tomorrow.                            Written discharge instructions were provided to the                            patient.                           - Resume previous diet.                           - Continue present medications.                           - Await pathology  results.                           - Return to GI clinic at appointment to be                            scheduled. Gerrit Heck, MD 11/16/2019 10:55:34 AM

## 2019-11-16 NOTE — Progress Notes (Signed)
A/ox3, pleased with MAC, report to RN 

## 2019-11-18 ENCOUNTER — Telehealth: Payer: Self-pay

## 2019-11-18 ENCOUNTER — Encounter: Payer: Self-pay | Admitting: Gastroenterology

## 2019-11-18 NOTE — Telephone Encounter (Signed)
1. Have you developed a fever since your procedure? No  2.   Have you had an respiratory symptoms (SOB or cough) since your procedure? No  3.   Have you tested positive for COVID 19 since your procedure No  4.   Have you had any family members/close contacts diagnosed with the COVID 19 since your procedure?  No  If yes to any of these questions please route to Laverna Peace, RN and Jennye Boroughs, RN.    Follow up Call-  Call back number 11/16/2019  Post procedure Call Back phone  # [phone]  Permission to leave phone message Yes  Some recent data might be hidden     Patient questions:  Do you have a fever, pain , or abdominal swelling? No. Pain Score  0 *  Have you tolerated food without any problems? Yes.    Have you been able to return to your normal activities? Yes.    Do you have any questions about your discharge instructions: Diet   No. Medications  No. Follow up visit  No.  Do you have questions or concerns about your Care? No.  Actions: * If pain score is 4 or above: No action needed, pain <4.

## 2019-12-14 ENCOUNTER — Other Ambulatory Visit: Payer: Self-pay

## 2019-12-14 ENCOUNTER — Encounter: Payer: Self-pay | Admitting: Gastroenterology

## 2019-12-14 ENCOUNTER — Ambulatory Visit: Payer: 59 | Admitting: Gastroenterology

## 2019-12-14 VITALS — BP 116/72 | HR 94 | Temp 97.3°F | Ht 64.0 in | Wt 192.2 lb

## 2019-12-14 DIAGNOSIS — K2990 Gastroduodenitis, unspecified, without bleeding: Secondary | ICD-10-CM | POA: Diagnosis not present

## 2019-12-14 DIAGNOSIS — K2970 Gastritis, unspecified, without bleeding: Secondary | ICD-10-CM | POA: Diagnosis not present

## 2019-12-14 DIAGNOSIS — R1013 Epigastric pain: Secondary | ICD-10-CM | POA: Diagnosis not present

## 2019-12-14 DIAGNOSIS — K581 Irritable bowel syndrome with constipation: Secondary | ICD-10-CM

## 2019-12-14 NOTE — Patient Instructions (Signed)
Return to office as needed  It was a pleasure to see you today!  Vito Cirigliano, D.O.  

## 2019-12-14 NOTE — Progress Notes (Signed)
P  Chief Complaint:    Abdominal pain, IBS-C, procedure follow-up  GI History: 24 year old female follows in the GI clinic for periumbilical/MEG pain since 05/2019, nausea, episodic nonbloody emesis/dry heaves.  Symptoms last throughout the day, but improved from daily to 2-3 days/week.  Worse with fried foods, fast foods.  No overt GI bleed.  EGD in 11/2019 essentially normal.  Constipation over that same time.,  Denies THC, illicit drug use, rare EtOH.  Started low FODMAP diet, increase dietary fiber, MiraLAX with good clinical response.  ER evaluation on 09/01/2019 largely unrevealing to include normal CBC, CMP, lipase, hCG, UA.  CT with area of focal fatty liver infiltration, but otherwise normal.  PGF with unknown type of abdominal CA, o/w no known family history of CRC, GI malignancy, liver disease, pancreatic disease, or IBD.   Endoscopic History: -EGD (11/2019, Dr. Barron Alvine): Normal.  Mild chronic gastritis without H. pylori on biopsy, normal duodenal biopsies  HPI:     Patient is a 24 y.o. female presenting to the Gastroenterology Clinic for follow-up.  Initially seen by me in 10/2019, with EGD completed last month which was essentially normal.  Today, states she is feeling well.  No complaints today.  Has since stopped taking Bentyl, Pepcid without any recurrence of abdominal pain.  Good bowel habits and tolerating all p.o. intake. Maintaining low FODMAP diet.  Having 1 BM/day without straining. Still taking Miralax 1 cap/daily with increased dietary fiber.   Review of systems:     No chest pain, no SOB, no fevers, no urinary sx   Past Medical History:  Diagnosis Date  . Anemia   . Narcolepsy   . Sleep apnea    Uses a CPAP     Patient's surgical history, family medical history, social history, medications and allergies were all reviewed in Epic    Current Outpatient Medications  Medication Sig Dispense Refill  . modafinil (PROVIGIL) 200 MG tablet 1 tab by mouth in  the AM and 1/2 to 1 tab by mouth at noon    . Multiple Vitamins-Minerals (MULTIVITAMIN WITH MINERALS) tablet Take 1 tablet by mouth daily.    Marland Kitchen dicyclomine (BENTYL) 20 MG tablet Take 1 tablet (20 mg total) by mouth 2 (two) times daily. (Patient not taking: Reported on 11/16/2019) 20 tablet 0  . famotidine (PEPCID) 20 MG tablet Take 1 tablet (20 mg total) by mouth 2 (two) times daily. (Patient not taking: Reported on 11/16/2019) 30 tablet 0  . ondansetron (ZOFRAN ODT) 4 MG disintegrating tablet 4mg  ODT q4 hours prn nausea/vomit (Patient not taking: Reported on 11/16/2019) 10 tablet 0   No current facility-administered medications for this visit.    Physical Exam:     BP 116/72   Pulse 94   Temp (!) 97.3 F (36.3 C)   Ht 5\' 4"  (1.626 m)   Wt 192 lb 4 oz (87.2 kg)   LMP 11/14/2019 (Exact Date)   BMI 33.00 kg/m   GENERAL:  Pleasant female in NAD PSYCH: : Cooperative, normal affect EENT:  conjunctiva pink, mucous membranes moist, neck supple without masses CARDIAC:  RRR, no murmur heard, no peripheral edema PULM: Normal respiratory effort, lungs CTA bilaterally, no wheezing ABDOMEN:  Nondistended, soft, nontender. No obvious masses, no hepatomegaly,  normal bowel sounds SKIN:  turgor, no lesions seen Musculoskeletal:  Normal muscle tone, normal strength NEURO: Alert and oriented x 3, no focal neurologic deficits   IMPRESSION and PLAN:    1) IBS-C: IBS-C related to recent  stressors related to school and COVID-19 pandemic.  Good clinical response to low FODMAP diet, increase dietary fiber, and MiraLAX. -Resume low FODMAP diet and high-fiber -Resume good fluid intake -Can try to wean MiraLAX to lowest effective dose -Given good clinical response and lack of other worrisome features, no plan for colonoscopy at this juncture  2) Abdominal pain: -Resolved -Likely related to the above constipation  3) Non-H. pylori gastritis: -EGD normal, with mild gastritis noted on biopsies.  Has  since weaned off Pepcid without recurrence -No need for any ongoing PPI, H2 RA, etc.  RTC as needed     I spent 21 minutes of time, including independent review of results as outlined above, communicating results with the patient directly, face-to-face time with the patient, coordinating care, ordering studies and medications as appropriate, and documentation.        Haskell ,DO, FACG 12/14/2019, 11:24 AM

## 2020-01-18 DIAGNOSIS — G4733 Obstructive sleep apnea (adult) (pediatric): Secondary | ICD-10-CM | POA: Diagnosis not present

## 2020-01-18 DIAGNOSIS — G47419 Narcolepsy without cataplexy: Secondary | ICD-10-CM | POA: Diagnosis not present

## 2020-03-09 DIAGNOSIS — G4733 Obstructive sleep apnea (adult) (pediatric): Secondary | ICD-10-CM | POA: Diagnosis not present

## 2020-03-29 ENCOUNTER — Ambulatory Visit: Payer: 59 | Admitting: Family

## 2020-03-29 ENCOUNTER — Encounter: Payer: Self-pay | Admitting: Family

## 2020-03-29 ENCOUNTER — Other Ambulatory Visit: Payer: Self-pay

## 2020-03-29 VITALS — BP 109/73 | HR 96 | Temp 97.0°F | Resp 16 | Wt 182.0 lb

## 2020-03-29 DIAGNOSIS — M7989 Other specified soft tissue disorders: Secondary | ICD-10-CM | POA: Diagnosis not present

## 2020-03-29 NOTE — Patient Instructions (Addendum)
[facility] should be [facility]. Please let me know if you have not heard from them in 1 week.

## 2020-03-29 NOTE — Progress Notes (Signed)
Subjective:    Patient ID: Dorothy [name], female    DOB: 25-Feb-1996, 24 y.o.   MRN: [id number]  HPI  Patient is a 24 yr old female who presents today with chief complaint of a "knot" In her right axilla. First  Noted a few months back.  Notes that the area is enlarged and tender to the touch.   Review of Systems See HPI  Past Medical History:  Diagnosis Date  . Anemia   . Narcolepsy   . Sleep apnea    Uses a CPAP      Social History   Socioeconomic History  . Marital status: Single    Spouse name: Not on file  . Number of children: Not on file  . Years of education: Not on file  . Highest education level: Not on file  Occupational History  . Not on file  Tobacco Use  . Smoking status: Never Smoker  . Smokeless tobacco: Never Used  Substance and Sexual Activity  . Alcohol use: Yes  . Drug use: No  . Sexual activity: Not Currently    Birth control/protection: None  Other Topics Concern  . Not on file  Social History Narrative   Lives with mom and her younger sister   Will begin at [employer]- wants to do forensic psychology   Enjoys reading, music, color, spend time with family     Social Determinants of Health   Financial Resource Strain:   . Difficulty of Paying Living Expenses:   Food Insecurity:   . Worried About Charity fundraiser in the Last Year:   . Arboriculturist in the Last Year:   Transportation Needs:   . Film/video editor (Medical):   Marland Kitchen Lack of Transportation (Non-Medical):   Physical Activity:   . Days of Exercise per Week:   . Minutes of Exercise per Session:   Stress:   . Feeling of Stress :   Social Connections:   . Frequency of Communication with Friends and Family:   . Frequency of Social Gatherings with Friends and Family:   . Attends Religious Services:   . Active Member of Clubs or Organizations:   . Attends Archivist Meetings:   Marland Kitchen Marital Status:   Intimate Partner Violence:   . Fear of Current or  Ex-Partner:   . Emotionally Abused:   Marland Kitchen Physically Abused:   . Sexually Abused:     No past surgical history on file.  Family History  Problem Relation Age of Onset  . Learning disabilities Mother   . Diabetes Father   . Rheum arthritis Sister   . Diabetes Maternal Grandmother   . Stroke Maternal Grandmother   . Heart disease Maternal Grandfather   . Diabetes Paternal Grandmother   . Cancer Paternal Grandfather        unknown  . Colon cancer Neg Hx   . Esophageal cancer Neg Hx   . Rectal cancer Neg Hx   . Stomach cancer Neg Hx     No Known Allergies  Current Outpatient Medications on File Prior to Visit  Medication Sig Dispense Refill  . modafinil (PROVIGIL) 200 MG tablet 1 tab by mouth in the AM and 1/2 to 1 tab by mouth at noon    . Multiple Vitamins-Minerals (MULTIVITAMIN WITH MINERALS) tablet Take 1 tablet by mouth daily.     No current facility-administered medications on file prior to visit.    BP 109/73 (BP Location: Right Arm,  Patient Position: Sitting, Cuff Size: Small)   Pulse 96   Temp (!) 97 F (36.1 C) (Temporal)   Resp 16   Wt 182 lb (82.6 kg)   SpO2 99%   BMI 31.24 kg/m       Objective:   Physical Exam Constitutional:      Appearance: Normal appearance.  Pulmonary:     Effort: Pulmonary effort is normal.   Breast: normal breast exam bilaterally. R axilla soft fatty tissue noted (more prominent than the left), no discrete masses, cyst or lymphadenopathy       Assessment & Plan:  Right Axillary swelling- new.  Will refer for [place] at the breast center for further evaluation.  This visit occurred during the SARS-CoV-2 public health emergency.  Safety protocols were in place, including screening questions prior to the visit, additional usage of staff PPE, and extensive cleaning of exam room while observing appropriate contact time as indicated for disinfecting solutions.

## 2020-04-15 ENCOUNTER — Other Ambulatory Visit: Payer: Self-pay | Admitting: Family

## 2020-04-15 ENCOUNTER — Ambulatory Visit
Admission: RE | Admit: 2020-04-15 | Discharge: 2020-04-15 | Disposition: A | Discharge status: Home / Self Care | Payer: 59 | Source: Ambulatory Visit | Attending: Family | Admitting: Family

## 2020-04-15 ENCOUNTER — Other Ambulatory Visit: Payer: Self-pay

## 2020-04-15 DIAGNOSIS — N6489 Other specified disorders of breast: Secondary | ICD-10-CM | POA: Diagnosis not present

## 2020-04-15 DIAGNOSIS — M7989 Other specified soft tissue disorders: Secondary | ICD-10-CM

## 2020-04-19 ENCOUNTER — Encounter: Payer: Self-pay | Admitting: Family

## 2020-04-19 ENCOUNTER — Other Ambulatory Visit: Payer: Self-pay

## 2020-04-19 ENCOUNTER — Ambulatory Visit (INDEPENDENT_AMBULATORY_CARE_PROVIDER_SITE_OTHER): Payer: 59 | Admitting: Family

## 2020-04-19 VITALS — BP 111/63 | HR 86 | Temp 97.2°F | Resp 16 | Ht 66.0 in | Wt 186.0 lb

## 2020-04-19 DIAGNOSIS — Z23 Encounter for immunization: Secondary | ICD-10-CM

## 2020-04-19 DIAGNOSIS — Z0000 Encounter for general adult medical examination without abnormal findings: Secondary | ICD-10-CM | POA: Diagnosis not present

## 2020-04-19 LAB — COMPREHENSIVE METABOLIC PANEL
ALT: 16 U/L (ref 0–35)
AST: 21 U/L (ref 0–37)
Albumin: 4.7 g/dL (ref 3.5–5.2)
Alkaline Phosphatase: 105 U/L (ref 39–117)
BUN: 7 mg/dL (ref 6–23)
CO2: 28 mEq/L (ref 19–32)
Calcium: 9.9 mg/dL (ref 8.4–10.5)
Chloride: 104 mEq/L (ref 96–112)
Creatinine, Ser: 0.72 mg/dL (ref 0.40–1.20)
GFR: 123.1 mL/min (ref >60.00)
Glucose, Bld: 92 mg/dL (ref 70–99)
Potassium: 4.5 mEq/L (ref 3.5–5.1)
Sodium: 137 mEq/L (ref 135–145)
Total Bilirubin: 0.3 mg/dL (ref 0.2–1.2)
Total Protein: 7.8 g/dL (ref 6.0–8.3)

## 2020-04-19 LAB — CBC WITH DIFFERENTIAL/PLATELET
Basophils Absolute: 0 10*3/uL (ref 0.0–0.1)
Basophils Relative: 0.5 % (ref 0.0–3.0)
Eosinophils Absolute: 0.1 10*3/uL (ref 0.0–0.7)
Eosinophils Relative: 3.3 % (ref 0.0–5.0)
HCT: 34.3 % — ABNORMAL LOW (ref 36.0–46.0)
Hemoglobin: 11.5 g/dL — ABNORMAL LOW (ref 12.0–15.0)
Lymphocytes Relative: 42.8 % (ref 12.0–46.0)
Lymphs Abs: 1.7 10*3/uL (ref 0.7–4.0)
MCHC: 33.5 g/dL (ref 30.0–36.0)
MCV: 86.9 fl (ref 78.0–100.0)
Monocytes Absolute: 0.3 10*3/uL (ref 0.1–1.0)
Monocytes Relative: 7.1 % (ref 3.0–12.0)
Neutro Abs: 1.8 10*3/uL (ref 1.4–7.7)
Neutrophils Relative %: 46.3 % (ref 43.0–77.0)
Platelets: 229 10*3/uL (ref 150.0–400.0)
RBC: 3.94 Mil/uL (ref 3.87–5.11)
RDW: 15.1 % (ref 11.5–15.5)
WBC: 4 10*3/uL (ref 4.0–10.5)

## 2020-04-19 LAB — LIPID PANEL
Cholesterol: 185 mg/dL (ref 0–200)
HDL: 54.9 mg/dL (ref >39.00)
LDL Cholesterol: 116 mg/dL — ABNORMAL HIGH (ref 0–99)
NonHDL: 130.2
Total CHOL/HDL Ratio: 3
Triglycerides: 69 mg/dL (ref 0.0–149.0)
VLDL: 13.8 mg/dL (ref 0.0–40.0)

## 2020-04-19 LAB — TSH: TSH: 1.86 u[IU]/mL (ref 0.35–4.50)

## 2020-04-19 NOTE — Progress Notes (Signed)
Subjective:    Patient ID: Dorothy [name], female    DOB: [birth year], 24 y.o.   MRN: [id number]  HPI  Patient presents today for complete physical.  Immunizations: tdap 2018 (she has received 2 of 3 gardisil vaccines) Diet:  Reports that her diet is healthy/improved. Reports that she has been cutting down on her meat consumption.   Wt Readings from Last 3 Encounters:  04/19/20 186 lb (84.4 kg)  03/29/20 182 lb (82.6 kg)  12/14/19 192 lb 4 oz (87.2 kg)  Exercise: reports that she is exercising about 3 days a week Pap Smear: 07/01/19 Vision: due Dental: due  OSA- uses a cpap, following with a sleep specialist.    Review of Systems  Constitutional: Negative for unexpected weight change.  HENT: Negative for hearing loss and rhinorrhea.   Eyes: Negative for visual disturbance.  Respiratory: Negative for cough and shortness of breath.   Cardiovascular: Negative for chest pain.  Gastrointestinal: Negative for blood in stool, constipation and diarrhea.  Genitourinary: Negative for dysuria, frequency and menstrual problem.  Musculoskeletal: Negative for arthralgias and myalgias.  Skin: Negative for rash.  Neurological: Negative for headaches.  Hematological: Negative for adenopathy.  Psychiatric/Behavioral:       Denies depression/anxiety       Past Medical History:  Diagnosis Date  . Anemia   . Narcolepsy   . Sleep apnea    Uses a CPAP      Social History   Socioeconomic History  . Marital status: Single    Spouse name: Not on file  . Number of children: Not on file  . Years of education: Not on file  . Highest education level: Not on file  Occupational History  . Not on file  Tobacco Use  . Smoking status: Never Smoker  . Smokeless tobacco: Never Used  Vaping Use  . Vaping Use: Never used  Substance and Sexual Activity  . Alcohol use: Yes  . Drug use: No  . Sexual activity: Not Currently    Birth control/protection: None  Other Topics Concern  . Not on  file  Social History Narrative   Lives with mom and her younger sister   Will begin at [employer]- wants to do forensic psychology   Enjoys reading, music, color, spend time with family     Social Determinants of Health   Financial Resource Strain:   . Difficulty of Paying Living Expenses:   Food Insecurity:   . Worried About Programme researcher, broadcasting/film/video in the Last Year:   . Barista in the Last Year:   Transportation Needs:   . Freight forwarder (Medical):   Marland Kitchen Lack of Transportation (Non-Medical):   Physical Activity:   . Days of Exercise per Week:   . Minutes of Exercise per Session:   Stress:   . Feeling of Stress :   Social Connections:   . Frequency of Communication with Friends and Family:   . Frequency of Social Gatherings with Friends and Family:   . Attends Religious Services:   . Active Member of Clubs or Organizations:   . Attends Banker Meetings:   Marland Kitchen Marital Status:   Intimate Partner Violence:   . Fear of Current or Ex-Partner:   . Emotionally Abused:   Marland Kitchen Physically Abused:   . Sexually Abused:     No past surgical history on file.  Family History  Problem Relation Age of Onset  . Learning disabilities Mother   .  Diabetes Father   . Rheum arthritis Sister   . Diabetes Maternal Grandmother   . Stroke Maternal Grandmother   . Heart disease Maternal Grandfather   . Diabetes Paternal Grandmother   . Cancer Paternal Grandfather        unknown  . Colon cancer Neg Hx   . Esophageal cancer Neg Hx   . Rectal cancer Neg Hx   . Stomach cancer Neg Hx     No Known Allergies  Current Outpatient Medications on File Prior to Visit  Medication Sig Dispense Refill  . modafinil (PROVIGIL) 200 MG tablet 1 tab by mouth in the AM and 1/2 to 1 tab by mouth at noon    . Multiple Vitamins-Minerals (MULTIVITAMIN WITH MINERALS) tablet Take 1 tablet by mouth daily.     No current facility-administered medications on file prior to visit.    BP  111/63 (BP Location: Right Arm, Patient Position: Sitting, Cuff Size: Small)   Pulse 86   Temp (!) 97.2 F (36.2 C) (Temporal)   Resp 16   Ht 5\' 6"  (1.676 m)   Wt 186 lb (84.4 kg)   SpO2 99%   BMI 30.02 kg/m    Objective:   Physical Exam  Physical Exam  Constitutional: She is oriented to person, place, and time. She appears well-developed and well-nourished. No distress.  HENT:  Head: Normocephalic and atraumatic.  Right Ear: Tympanic membrane and ear canal normal.  Left Ear: Tympanic membrane and ear canal normal.  Mouth/Throat: Not examined- pt wearing mask Eyes: Pupils are equal, round, and reactive to light. No scleral icterus.  Neck: Normal range of motion. No thyromegaly present.  Cardiovascular: Normal rate and regular rhythm.   No murmur heard. Pulmonary/Chest: Effort normal and breath sounds normal. No respiratory distress. He has no wheezes. She has no rales. She exhibits no tenderness.  Abdominal: Soft. Bowel sounds are normal. She exhibits no distension and no mass. There is no tenderness. There is no rebound and no guarding.  Musculoskeletal: She exhibits no edema.  Lymphadenopathy:    She has no cervical adenopathy.  Neurological: She is alert and oriented to person, place, and time. She has normal patellar reflexes. She exhibits normal muscle tone. Coordination normal.  Skin: Skin is warm and dry.  Psychiatric: She has a normal mood and affect. Her behavior is normal. Judgment and thought content normal.  Breast/pelvic: deferred          Assessment & Plan:   Preventative care- encouraged pt to continue healthy diet, exercise and weight loss efforts. Will obtain routine lab work. Pap smear is up to date. Gardisil #3.  This visit occurred during the SARS-CoV-2 public health emergency.  Safety protocols were in place, including screening questions prior to the visit, additional usage of staff PPE, and extensive cleaning of exam room while observing appropriate  contact time as indicated for disinfecting solutions.       Assessment & Plan:

## 2020-04-19 NOTE — Patient Instructions (Addendum)
Please schedule a routine vision and eye exam.   Complete lab work prior to leaving.    Preventive Care 55-22 Years Old, Female Preventive care refers to visits with your health care provider and lifestyle choices that can promote health and wellness. This includes:  A yearly physical exam. This may also be called an annual well check.  Regular dental visits and eye exams.  Immunizations.  Screening for certain conditions.  Healthy lifestyle choices, such as eating a healthy diet, getting regular exercise, not using drugs or products that contain nicotine and tobacco, and limiting alcohol use. What can I expect for my preventive care visit? Physical exam Your health care provider will check your:  Height and weight. This may be used to calculate body mass index (BMI), which tells if you are at a healthy weight.  Heart rate and blood pressure.  Skin for abnormal spots. Counseling Your health care provider may ask you questions about your:  Alcohol, tobacco, and drug use.  Emotional well-being.  Home and relationship well-being.  Sexual activity.  Eating habits.  Work and work Statistician.  Method of birth control.  Menstrual cycle.  Pregnancy history. What immunizations do I need?  Influenza (flu) vaccine  This is recommended every year. Tetanus, diphtheria, and pertussis (Tdap) vaccine  You may need a Td booster every 10 years. Varicella (chickenpox) vaccine  You may need this if you have not been vaccinated. Human papillomavirus (HPV) vaccine  If recommended by your health care provider, you may need three doses over 6 months. Measles, mumps, and rubella (MMR) vaccine  You may need at least one dose of MMR. You may also need a second dose. Meningococcal conjugate (MenACWY) vaccine  One dose is recommended if you are age 22-21 years and a first-year college student living in a residence hall, or if you have one of several medical conditions. You may  also need additional booster doses. Pneumococcal conjugate (PCV13) vaccine  You may need this if you have certain conditions and were not previously vaccinated. Pneumococcal polysaccharide (PPSV23) vaccine  You may need one or two doses if you smoke cigarettes or if you have certain conditions. Hepatitis A vaccine  You may need this if you have certain conditions or if you travel or work in places where you may be exposed to hepatitis A. Hepatitis B vaccine  You may need this if you have certain conditions or if you travel or work in places where you may be exposed to hepatitis B. Haemophilus influenzae type b (Hib) vaccine  You may need this if you have certain conditions. You may receive vaccines as individual doses or as more than one vaccine together in one shot (combination vaccines). Talk with your health care provider about the risks and benefits of combination vaccines. What tests do I need?  Blood tests  Lipid and cholesterol levels. These may be checked every 5 years starting at age 22.  Hepatitis C test.  Hepatitis B test. Screening  Diabetes screening. This is done by checking your blood sugar (glucose) after you have not eaten for a while (fasting).  Sexually transmitted disease (STD) testing.  BRCA-related cancer screening. This may be done if you have a family history of breast, ovarian, tubal, or peritoneal cancers.  Pelvic exam and Pap test. This may be done every 3 years starting at age 22. Starting at age 22, this may be done every 5 years if you have a Pap test in combination with an HPV test. Talk  with your health care provider about your test results, treatment options, and if necessary, the need for more tests. Follow these instructions at home: Eating and drinking   Eat a diet that includes fresh fruits and vegetables, whole grains, lean protein, and low-fat dairy.  Take vitamin and mineral supplements as recommended by your health care  provider.  Do not drink alcohol if: ? Your health care provider tells you not to drink. ? You are pregnant, may be pregnant, or are planning to become pregnant.  If you drink alcohol: ? Limit how much you have to 0-1 drink a day. ? Be aware of how much alcohol is in your drink. In the [place] one drink equals one 12 oz bottle of beer (355 mL), one 5 oz glass of wine (148 mL), or one 1 oz glass of hard liquor (44 mL). Lifestyle  Take daily care of your teeth and gums.  Stay active. Exercise for at least 30 minutes on 5 or more days each week.  Do not use any products that contain nicotine or tobacco, such as cigarettes, e-cigarettes, and chewing tobacco. If you need help quitting, ask your health care provider.  If you are sexually active, practice safe sex. Use a condom or other form of birth control (contraception) in order to prevent pregnancy and STIs (sexually transmitted infections). If you plan to become pregnant, see your health care provider for a preconception visit. What's next?  Visit your health care provider once a year for a well check visit.  Ask your health care provider how often you should have your eyes and teeth checked.  Stay up to date on all vaccines. This information is not intended to replace advice given to you by your health care provider. Make sure you discuss any questions you have with your health care provider. Document Revised: 07/03/2018 Document Reviewed: 07/03/2018 Elsevier Patient Education  2020 [employer].

## 2020-05-02 ENCOUNTER — Encounter: Payer: Self-pay | Admitting: Family

## 2020-05-03 ENCOUNTER — Other Ambulatory Visit (HOSPITAL_COMMUNITY)
Admission: RE | Admit: 2020-05-03 | Discharge: 2020-05-03 | Disposition: A | Discharge status: Home / Self Care | Payer: 59 | Source: Ambulatory Visit | Attending: Family | Admitting: Family

## 2020-05-03 ENCOUNTER — Encounter: Payer: Self-pay | Admitting: Family

## 2020-05-03 ENCOUNTER — Ambulatory Visit: Payer: 59 | Admitting: Family

## 2020-05-03 ENCOUNTER — Other Ambulatory Visit: Payer: Self-pay

## 2020-05-03 VITALS — BP 121/70 | HR 91 | Temp 97.5°F | Resp 16 | Ht 66.0 in | Wt 184.0 lb

## 2020-05-03 DIAGNOSIS — R102 Pelvic and perineal pain: Secondary | ICD-10-CM | POA: Diagnosis not present

## 2020-05-03 DIAGNOSIS — Z113 Encounter for screening for infections with a predominantly sexual mode of transmission: Secondary | ICD-10-CM

## 2020-05-03 NOTE — Progress Notes (Signed)
Subjective:    Patient ID: Dorothy [name], female    DOB: [birth year], 24 y.o.   MRN: [id number]  HPI  Patient is a 24 yr old female who presents today with chief complaint of discomfort "near vagina on the inside." First noticed 2 days after intercourse.  Denies discharge.  She has her menstrual cycle today.  New partner 2 weeks ago-  no condom.   Review of Systems    see HPI  Past Medical History:  Diagnosis Date  . Anemia   . Narcolepsy   . Sleep apnea    Uses a CPAP      Social History   Socioeconomic History  . Marital status: Single    Spouse name: Not on file  . Number of children: Not on file  . Years of education: Not on file  . Highest education level: Not on file  Occupational History  . Not on file  Tobacco Use  . Smoking status: Never Smoker  . Smokeless tobacco: Never Used  Vaping Use  . Vaping Use: Never used  Substance and Sexual Activity  . Alcohol use: Yes  . Drug use: No  . Sexual activity: Not Currently    Birth control/protection: None  Other Topics Concern  . Not on file  Social History Narrative   Lives with mom and her younger sister   Will begin at [employer]- wants to do forensic psychology   Enjoys reading, music, color, spend time with family     Social Determinants of Health   Financial Resource Strain:   . Difficulty of Paying Living Expenses:   Food Insecurity:   . Worried About Programme researcher, broadcasting/film/video in the Last Year:   . Barista in the Last Year:   Transportation Needs:   . Freight forwarder (Medical):   Marland Kitchen Lack of Transportation (Non-Medical):   Physical Activity:   . Days of Exercise per Week:   . Minutes of Exercise per Session:   Stress:   . Feeling of Stress :   Social Connections:   . Frequency of Communication with Friends and Family:   . Frequency of Social Gatherings with Friends and Family:   . Attends Religious Services:   . Active Member of Clubs or Organizations:   . Attends Tax inspector Meetings:   Marland Kitchen Marital Status:   Intimate Partner Violence:   . Fear of Current or Ex-Partner:   . Emotionally Abused:   Marland Kitchen Physically Abused:   . Sexually Abused:     No past surgical history on file.  Family History  Problem Relation Age of Onset  . Learning disabilities Mother   . Diabetes Father   . Rheum arthritis Sister   . Diabetes Maternal Grandmother   . Stroke Maternal Grandmother   . Heart disease Maternal Grandfather   . Diabetes Paternal Grandmother   . Cancer Paternal Grandfather        unknown  . Colon cancer Neg Hx   . Esophageal cancer Neg Hx   . Rectal cancer Neg Hx   . Stomach cancer Neg Hx     No Known Allergies  Current Outpatient Medications on File Prior to Visit  Medication Sig Dispense Refill  . modafinil (PROVIGIL) 200 MG tablet 1 tab by mouth in the AM and 1/2 to 1 tab by mouth at noon    . Multiple Vitamins-Minerals (MULTIVITAMIN WITH MINERALS) tablet Take 1 tablet by mouth daily.  No current facility-administered medications on file prior to visit.    BP 121/70 (BP Location: Right Arm, Patient Position: Sitting, Cuff Size: Small)   Pulse 91   Temp (!) 97.5 F (36.4 C) (Temporal)   Resp 16   Ht 5\' 6"  (1.676 m)   Wt 184 lb (83.5 kg)   SpO2 99%   BMI 29.70 kg/m    Objective:   Physical Exam Constitutional:      Appearance: Normal appearance.  Pulmonary:     Effort: Pulmonary effort is normal.  Neurological:     Mental Status: She is alert.  Psychiatric:        Mood and Affect: Mood normal.        Behavior: Behavior normal.           Assessment & Plan:  Vaginal pain- New. ? HSV. Unable to complete exam today due to menses. Will obtain HSV 2 IgM/IgG. Will also perform urine cytology for gonorrhea/chlamydia, trich. Obtain HIV/RPR testing.  Reminded pt on the importance of strict condom use. Follow up in 1 week for pelvic exam.   This visit occurred during the SARS-CoV-2 public health emergency.  Safety  protocols were in place, including screening questions prior to the visit, additional usage of staff PPE, and extensive cleaning of exam room while observing appropriate contact time as indicated for disinfecting solutions.

## 2020-05-04 LAB — HSV TYPE I/II IGG, IGMW/ REFLEX
HSV 1 Glycoprotein G Ab, IgG: <0.91 index (ref 0.00–0.90)
HSV 1 IgM: <1:10 {titer}
HSV 2 IgG, Type Spec: <0.91 index (ref 0.00–0.90)
HSV 2 IgM: <1:10 {titer}

## 2020-05-04 LAB — RPR: RPR Ser Ql: NONREACTIVE

## 2020-05-04 LAB — URINE CYTOLOGY ANCILLARY ONLY
Chlamydia: POSITIVE — AB
Comment: NEGATIVE
Comment: NEGATIVE
Comment: NORMAL
Neisseria Gonorrhea: NEGATIVE
Trichomonas: NEGATIVE

## 2020-05-04 LAB — HIV ANTIBODY (ROUTINE TESTING W REFLEX): HIV 1&2 Ab, 4th Generation: NONREACTIVE

## 2020-05-05 ENCOUNTER — Encounter: Payer: Self-pay | Admitting: Family

## 2020-05-05 NOTE — Telephone Encounter (Signed)
Patient advised of results and provider's advise. She will be here tomorrow for nv .

## 2020-05-05 NOTE — Telephone Encounter (Signed)
Dorothy [name], please schedule pt a nurse visit for azithromycin 1000mg  PO X 1.  She should notify partner(s) so that they can also be treated.  Reinforce importance of condom use.   , could you please file paperwork for the health department for chlamydia infection?

## 2020-05-06 ENCOUNTER — Other Ambulatory Visit: Payer: Self-pay

## 2020-05-06 ENCOUNTER — Ambulatory Visit (INDEPENDENT_AMBULATORY_CARE_PROVIDER_SITE_OTHER): Payer: 59

## 2020-05-06 DIAGNOSIS — A749 Chlamydial infection, unspecified: Secondary | ICD-10-CM | POA: Diagnosis not present

## 2020-05-06 MED ORDER — AZITHROMYCIN 250 MG PO TABS
1000.0000 mg | ORAL_TABLET | Freq: Once | ORAL | Status: AC
  Administered 2020-05-06: 1000 mg via ORAL

## 2020-05-06 NOTE — Progress Notes (Signed)
CMA note reviewed and agree.  

## 2020-05-06 NOTE — Telephone Encounter (Signed)
Communicable Disease Report completed and faxed to [facility] Department at [phone].

## 2020-05-06 NOTE — Progress Notes (Signed)
Patient here today for azithromycin 500 mg two tablets (1000 mg) total. Two tablets given to patient.  Taken orally in office. Patient tolerated well.  Advised to eat something shortly after as it can upset your stomach. Patient verbalized understanding.

## 2020-06-06 ENCOUNTER — Other Ambulatory Visit (HOSPITAL_COMMUNITY)
Admission: RE | Admit: 2020-06-06 | Discharge: 2020-06-06 | Disposition: A | Discharge status: Home / Self Care | Payer: 59 | Source: Ambulatory Visit | Attending: Medical | Admitting: Medical

## 2020-06-06 ENCOUNTER — Ambulatory Visit: Payer: 59 | Admitting: Medical

## 2020-06-06 ENCOUNTER — Other Ambulatory Visit: Payer: Self-pay

## 2020-06-06 VITALS — BP 109/70 | HR 71 | Resp 18 | Ht 66.0 in | Wt 187.0 lb

## 2020-06-06 DIAGNOSIS — Z202 Contact with and (suspected) exposure to infections with a predominantly sexual mode of transmission: Secondary | ICD-10-CM | POA: Insufficient documentation

## 2020-06-06 MED ORDER — FLUCONAZOLE 150 MG PO TABS
150.0000 mg | ORAL_TABLET | Freq: Once | ORAL | 0 refills | Status: AC
Start: 2020-06-06 — End: 2020-06-06

## 2020-06-06 MED FILL — FLUCONAZOLE 150 MG TABS: 150 | 1 days supply | Qty: 1 | Fill #0

## 2020-06-06 NOTE — Progress Notes (Addendum)
Subjective:    Patient ID: Dorothy [name], female    DOB: 1996/06/25, 24 y.o.   MRN: [id number]  HPI  Pt in for follow up.   Pt was treated for chlamydia. Pt has some vaginal irritation. Despite being treated with azithromycin with 1 gram.  Pt thinks might have an odor.   Pt denies having re-exposure to partner who she suspects gave chlaymdia.      Review of Systems  Constitutional: Negative for chills, fatigue and fever.  Respiratory: Negative for apnea, choking, shortness of breath and wheezing.   Cardiovascular: Negative for chest pain and palpitations.  Gastrointestinal: Negative for abdominal pain.  Genitourinary: Positive for vaginal discharge and vaginal pain. Negative for difficulty urinating, dysuria, flank pain, genital sores, hematuria, pelvic pain and vaginal bleeding.  Skin: Negative for rash.  Hematological: Negative for adenopathy. Does not bruise/bleed easily.    Past Medical History:  Diagnosis Date  . Anemia   . Narcolepsy   . Sleep apnea    Uses a CPAP      Social History   Socioeconomic History  . Marital status: Single    Spouse name: Not on file  . Number of children: Not on file  . Years of education: Not on file  . Highest education level: Not on file  Occupational History  . Not on file  Tobacco Use  . Smoking status: Never Smoker  . Smokeless tobacco: Never Used  Vaping Use  . Vaping Use: Never used  Substance and Sexual Activity  . Alcohol use: Yes  . Drug use: No  . Sexual activity: Not Currently    Birth control/protection: None  Other Topics Concern  . Not on file  Social History Narrative   Lives with mom and her younger sister   Will begin at [employer]- wants to do forensic psychology   Enjoys reading, music, color, spend time with family     Social Determinants of Health   Financial Resource Strain:   . Difficulty of Paying Living Expenses:   Food Insecurity:   . Worried About Programme researcher, broadcasting/film/video in the Last  Year:   . Barista in the Last Year:   Transportation Needs:   . Freight forwarder (Medical):   Marland Kitchen Lack of Transportation (Non-Medical):   Physical Activity:   . Days of Exercise per Week:   . Minutes of Exercise per Session:   Stress:   . Feeling of Stress :   Social Connections:   . Frequency of Communication with Friends and Family:   . Frequency of Social Gatherings with Friends and Family:   . Attends Religious Services:   . Active Member of Clubs or Organizations:   . Attends Banker Meetings:   Marland Kitchen Marital Status:   Intimate Partner Violence:   . Fear of Current or Ex-Partner:   . Emotionally Abused:   Marland Kitchen Physically Abused:   . Sexually Abused:     No past surgical history on file.  Family History  Problem Relation Age of Onset  . Learning disabilities Mother   . Diabetes Father   . Rheum arthritis Sister   . Diabetes Maternal Grandmother   . Stroke Maternal Grandmother   . Heart disease Maternal Grandfather   . Diabetes Paternal Grandmother   . Cancer Paternal Grandfather        unknown  . Colon cancer Neg Hx   . Esophageal cancer Neg Hx   . Rectal cancer Neg  Hx   . Stomach cancer Neg Hx     No Known Allergies  Current Outpatient Medications on File Prior to Visit  Medication Sig Dispense Refill  . modafinil (PROVIGIL) 200 MG tablet 1 tab by mouth in the AM and 1/2 to 1 tab by mouth at noon    . Multiple Vitamins-Minerals (MULTIVITAMIN WITH MINERALS) tablet Take 1 tablet by mouth daily. (Patient not taking: Reported on 06/06/2020)     No current facility-administered medications on file prior to visit.    BP 109/70   Pulse 71   Resp 18   Ht 5\' 6"  (1.676 m)   Wt 187 lb (84.8 kg)   LMP 05/23/2020 (Approximate)   SpO2 99%   BMI 30.18 kg/m       Objective:   Physical Exam  General- No acute distress. Pleasant patient. Neck- Full range of motion, no jvd Lungs- Clear, even and unlabored. Heart- regular rate and  rhythm. Neurologic- CNII- XII grossly intact.  Abdomen- soft, nt, nd, +bs, no rebound or guarding. Faint suprapubic tenderness.  Back- no cva tenderness.        Assessment & Plan:  Your were treated for chlamydia. You describe some similar symptoms. Will repeat urine ancillary studies same as before. Include gonorrhea, chlamydia, trichomonas, yeasst and bacterial vaginosis.  Practice safe sex.  Follow up as regularly scheduled or as needed.  Time spent with patient today was 15  minutes which consisted of chart review, discussing diagnosis, work up ,treatment and documentation.

## 2020-06-06 NOTE — Patient Instructions (Addendum)
Your were treated for chlamydia. You describe some similar symptoms. Will repeat urine ancillary studies same as before. Include gonorrhea, chlamydia, trichomonas, yeast and bacterial vaginosis.  Practice safe sex.  You mentioned slight white dc. So give difllucan one tab x 1 day pending above.(recent antibiotic use)  Follow up as regularly scheduled or as needed.

## 2020-06-08 LAB — URINE CYTOLOGY ANCILLARY ONLY
Bacterial Vaginitis-Urine: NEGATIVE
Chlamydia: NEGATIVE
Comment: NEGATIVE
Comment: NEGATIVE
Comment: NORMAL
Neisseria Gonorrhea: NEGATIVE
Trichomonas: NEGATIVE

## 2020-06-20 MED FILL — MODAFINIL 200 MG TABS: 200 | 30 days supply | Qty: 60 | Fill #0

## 2020-06-21 DIAGNOSIS — Z03818 Encounter for observation for suspected exposure to other biological agents ruled out: Secondary | ICD-10-CM | POA: Diagnosis not present

## 2020-07-06 ENCOUNTER — Encounter: Payer: Self-pay | Admitting: Medical

## 2020-07-25 DIAGNOSIS — G4733 Obstructive sleep apnea (adult) (pediatric): Secondary | ICD-10-CM | POA: Diagnosis not present

## 2020-07-25 DIAGNOSIS — Z9989 Dependence on other enabling machines and devices: Secondary | ICD-10-CM | POA: Diagnosis not present

## 2020-07-25 DIAGNOSIS — G47411 Narcolepsy with cataplexy: Secondary | ICD-10-CM | POA: Diagnosis not present

## 2020-07-26 DIAGNOSIS — N760 Acute vaginitis: Secondary | ICD-10-CM | POA: Diagnosis not present

## 2020-07-26 DIAGNOSIS — Z01419 Encounter for gynecological examination (general) (routine) without abnormal findings: Secondary | ICD-10-CM | POA: Diagnosis not present

## 2020-10-21 ENCOUNTER — Other Ambulatory Visit: Payer: Self-pay

## 2020-10-21 ENCOUNTER — Encounter: Payer: Self-pay | Admitting: Family

## 2020-10-21 ENCOUNTER — Ambulatory Visit: Payer: 59 | Attending: Internal Medicine

## 2020-10-21 ENCOUNTER — Other Ambulatory Visit (HOSPITAL_BASED_OUTPATIENT_CLINIC_OR_DEPARTMENT_OTHER): Payer: Self-pay | Admitting: Internal Medicine

## 2020-10-21 ENCOUNTER — Other Ambulatory Visit (HOSPITAL_COMMUNITY)
Admission: RE | Admit: 2020-10-21 | Discharge: 2020-10-21 | Disposition: A | Discharge status: Home / Self Care | Payer: 59 | Source: Ambulatory Visit | Attending: Family | Admitting: Family

## 2020-10-21 ENCOUNTER — Ambulatory Visit: Payer: 59 | Admitting: Family

## 2020-10-21 VITALS — BP 115/60 | HR 75 | Temp 98.3°F | Resp 16 | Ht 66.0 in | Wt 187.0 lb

## 2020-10-21 DIAGNOSIS — Z23 Encounter for immunization: Secondary | ICD-10-CM

## 2020-10-21 DIAGNOSIS — Z7185 Encounter for immunization safety counseling: Secondary | ICD-10-CM | POA: Diagnosis not present

## 2020-10-21 DIAGNOSIS — N760 Acute vaginitis: Secondary | ICD-10-CM

## 2020-10-21 MED ORDER — FLUCONAZOLE 150 MG PO TABS
ORAL_TABLET | ORAL | 0 refills | Status: DC
Start: 2020-10-21 — End: 2020-10-21

## 2020-10-21 MED ORDER — METRONIDAZOLE 0.75 % VA GEL: 1.0000 | Freq: Every day | VAGINAL | 0 refills | Status: DC

## 2020-10-21 MED FILL — metroNIDAZOLE 0.75 % GEL: 0.75 | 5 days supply | Qty: 70 | Fill #0

## 2020-10-21 MED FILL — FLUCONAZOLE 150 MG TABS: 150 | 3 days supply | Qty: 2 | Fill #0

## 2020-10-21 NOTE — Patient Instructions (Signed)
Please begin diflucan and metrogel. We will contact you with your results by mychart early next week.  Call if symptoms worsen or if symptoms do not improve.

## 2020-10-21 NOTE — Progress Notes (Signed)
Subjective:     Patient ID: Dorothy [name], female   DOB: 1996/07/03, 24 y.o.   MRN: [id number]  HPI  Patient is a 24 yr old female who presents today with chief complaint of vaginal discharge and itching. States that she was treated for BV in [place] and was treated with metronidazole.  No new partner. Notes a fishy odor. She reports that symptoms did not improve after treatment.  She is taking a probiotic.      Review of Systems See HPI  Past Medical History:  Diagnosis Date  . Anemia   . Narcolepsy   . Sleep apnea    Uses a CPAP      Social History   Socioeconomic History  . Marital status: Single    Spouse name: Not on file  . Number of children: Not on file  . Years of education: Not on file  . Highest education level: Not on file  Occupational History  . Not on file  Tobacco Use  . Smoking status: Never Smoker  . Smokeless tobacco: Never Used  Vaping Use  . Vaping Use: Never used  Substance and Sexual Activity  . Alcohol use: Yes  . Drug use: No  . Sexual activity: Not Currently    Birth control/protection: None  Other Topics Concern  . Not on file  Social History Narrative   Lives with mom and her younger sister   Will begin at [employer]- wants to do forensic psychology   Enjoys reading, music, color, spend time with family     Social Determinants of Health   Financial Resource Strain: Not on file  Food Insecurity: Not on file  Transportation Needs: Not on file  Physical Activity: Not on file  Stress: Not on file  Social Connections: Not on file  Intimate Partner Violence: Not on file    No past surgical history on file.  Family History  Problem Relation Age of Onset  . Learning disabilities Mother   . Diabetes Father   . Rheum arthritis Sister   . Diabetes Maternal Grandmother   . Stroke Maternal Grandmother   . Heart disease Maternal Grandfather   . Diabetes Paternal Grandmother   . Cancer Paternal Grandfather        unknown  .  Colon cancer Neg Hx   . Esophageal cancer Neg Hx   . Rectal cancer Neg Hx   . Stomach cancer Neg Hx     No Known Allergies  Current Outpatient Medications on File Prior to Visit  Medication Sig Dispense Refill  . modafinil (PROVIGIL) 200 MG tablet 1 tab by mouth in the AM and 1/2 to 1 tab by mouth at noon    . Multiple Vitamins-Minerals (MULTIVITAMIN WITH MINERALS) tablet Take 1 tablet by mouth daily.     No current facility-administered medications on file prior to visit.    There were no vitals taken for this visit.      Objective:   Physical Exam Constitutional:      Appearance: Normal appearance.  HENT:     Right Ear: Tympanic membrane and ear canal normal.     Left Ear: Tympanic membrane and ear canal normal.  Neurological:     Mental Status: She is alert.   GYN:  Normal external vulva- some white dishcharge noted at introitus    Assessment:     Vaginitis- will rx empirically with metrogel/diflucan.  aptima swab will be sent for BV/Yeast/Trich/GC/Chlamydia.    Immunization counseling- recommended that  she obtain her second covid shot. She had her first shot back in August.     Plan:     This visit occurred during the SARS-CoV-2 public health emergency.  Safety protocols were in place, including screening questions prior to the visit, additional usage of staff PPE, and extensive cleaning of exam room while observing appropriate contact time as indicated for disinfecting solutions.

## 2020-10-21 NOTE — Progress Notes (Signed)
   Covid-19 Vaccination Clinic  Name:  Dorothy [name]    MRN: [id number] DOB: 22-Sep-1996  10/21/2020  [name] was observed post Covid-19 immunization for 15 minutes without incident. She was provided with Vaccine Information Sheet and instruction to access the V-Safe system.   [name] was instructed to call 911 with any severe reactions post vaccine: Marland Kitchen Difficulty breathing  . Swelling of face and throat  . A fast heartbeat  . A bad rash all over body  . Dizziness and weakness   Immunizations Administered    Name Date Dose VIS Date Route   Pfizer COVID-19 Vaccine 10/21/2020 11:42 AM 0.3 mL 08/24/2020 Intramuscular   Manufacturer: [employer], [employer]   Lot: [id number]   NDC: [id number]

## 2020-10-24 LAB — CERVICOVAGINAL ANCILLARY ONLY
Bacterial Vaginitis (gardnerella): POSITIVE — AB
Candida Glabrata: NEGATIVE
Candida Vaginitis: POSITIVE — AB
Chlamydia: NEGATIVE
Comment: NEGATIVE
Comment: NEGATIVE
Comment: NEGATIVE
Comment: NEGATIVE
Comment: NEGATIVE
Comment: NORMAL
Neisseria Gonorrhea: NEGATIVE
Trichomonas: NEGATIVE

## 2020-10-24 MED FILL — PFIZER-BIONTECH COVID-19 VA: 30 | 1 days supply | Qty: 0 | Fill #0

## 2020-10-27 ENCOUNTER — Encounter: Payer: Self-pay | Admitting: Family

## 2020-12-01 IMAGING — DX ABDOMEN - 2 VIEW
3 series · 3 of 3 positions shown · non-contrast
Comparison: None.

CLINICAL DATA: 3wks of abd pain, nausea. generalized abd pain, ?
constipation

EXAM:
ABDOMEN - 2 VIEW

[abdomen erect]
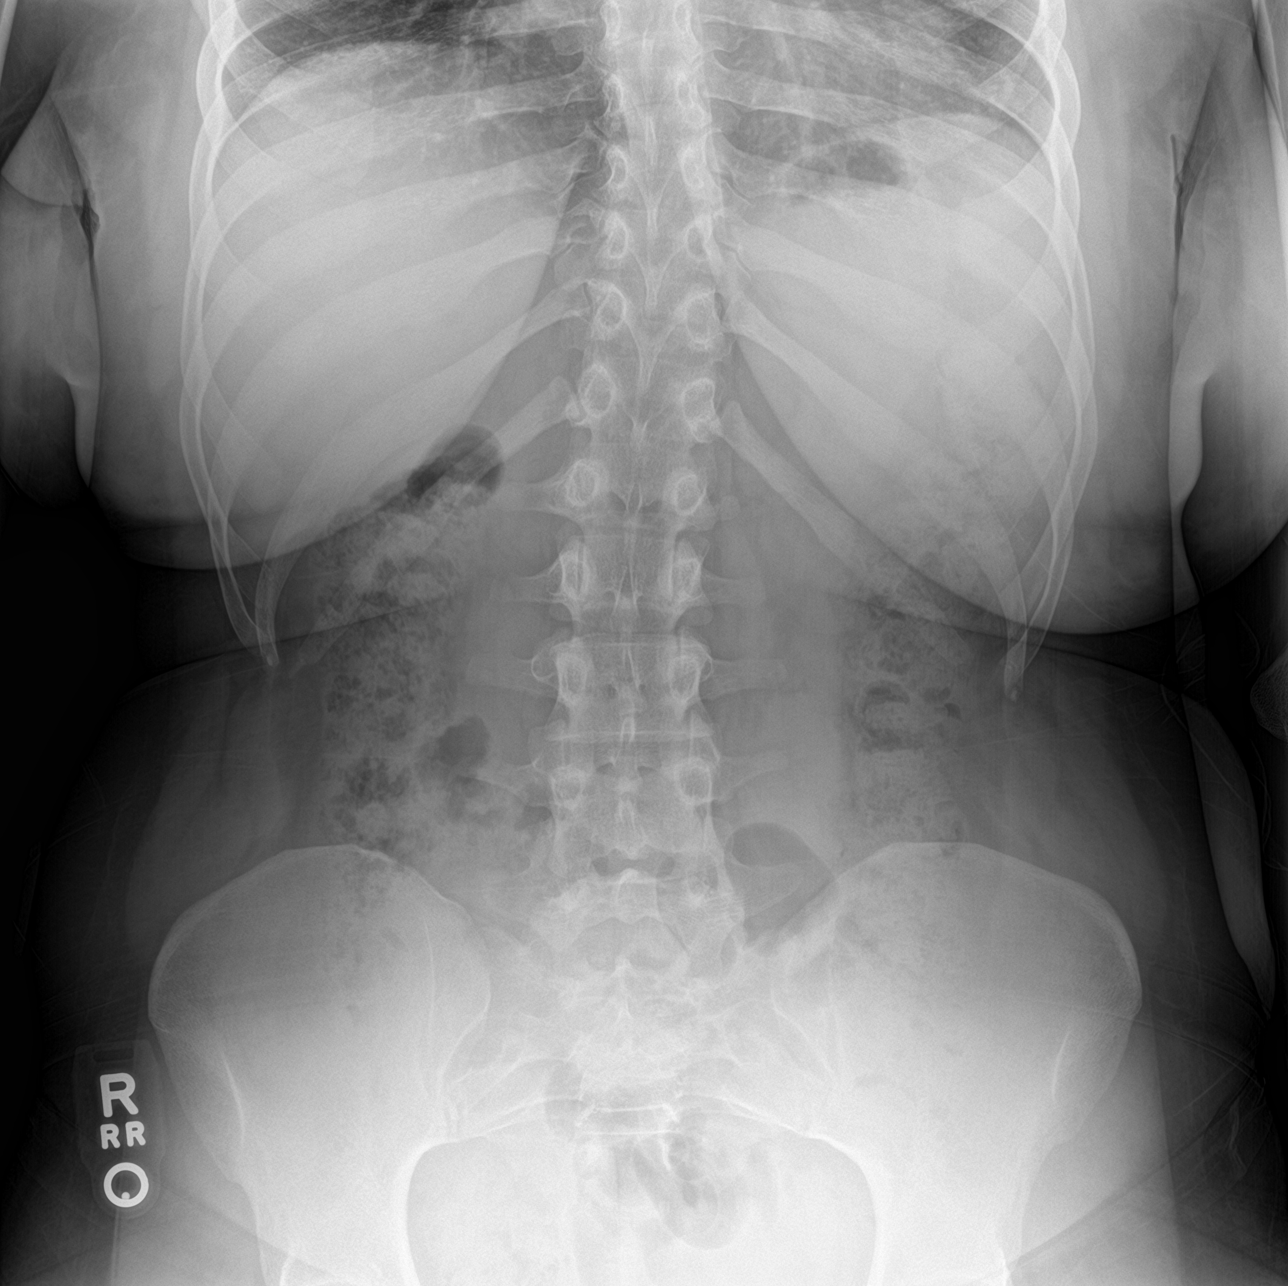

[abdomen supine (1 of 2)]
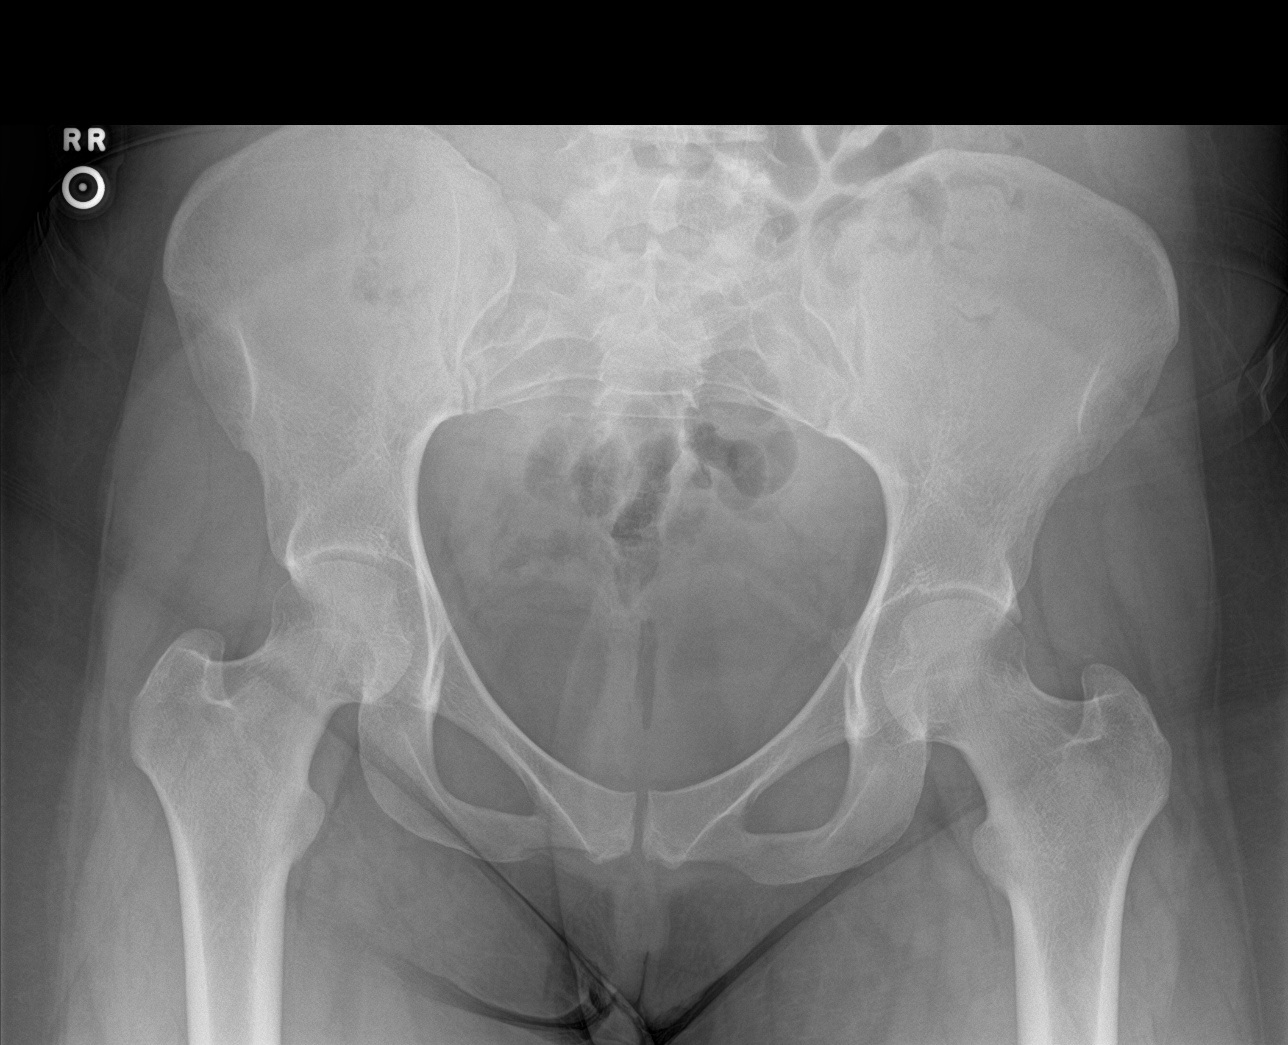

[abdomen supine (2 of 2)]
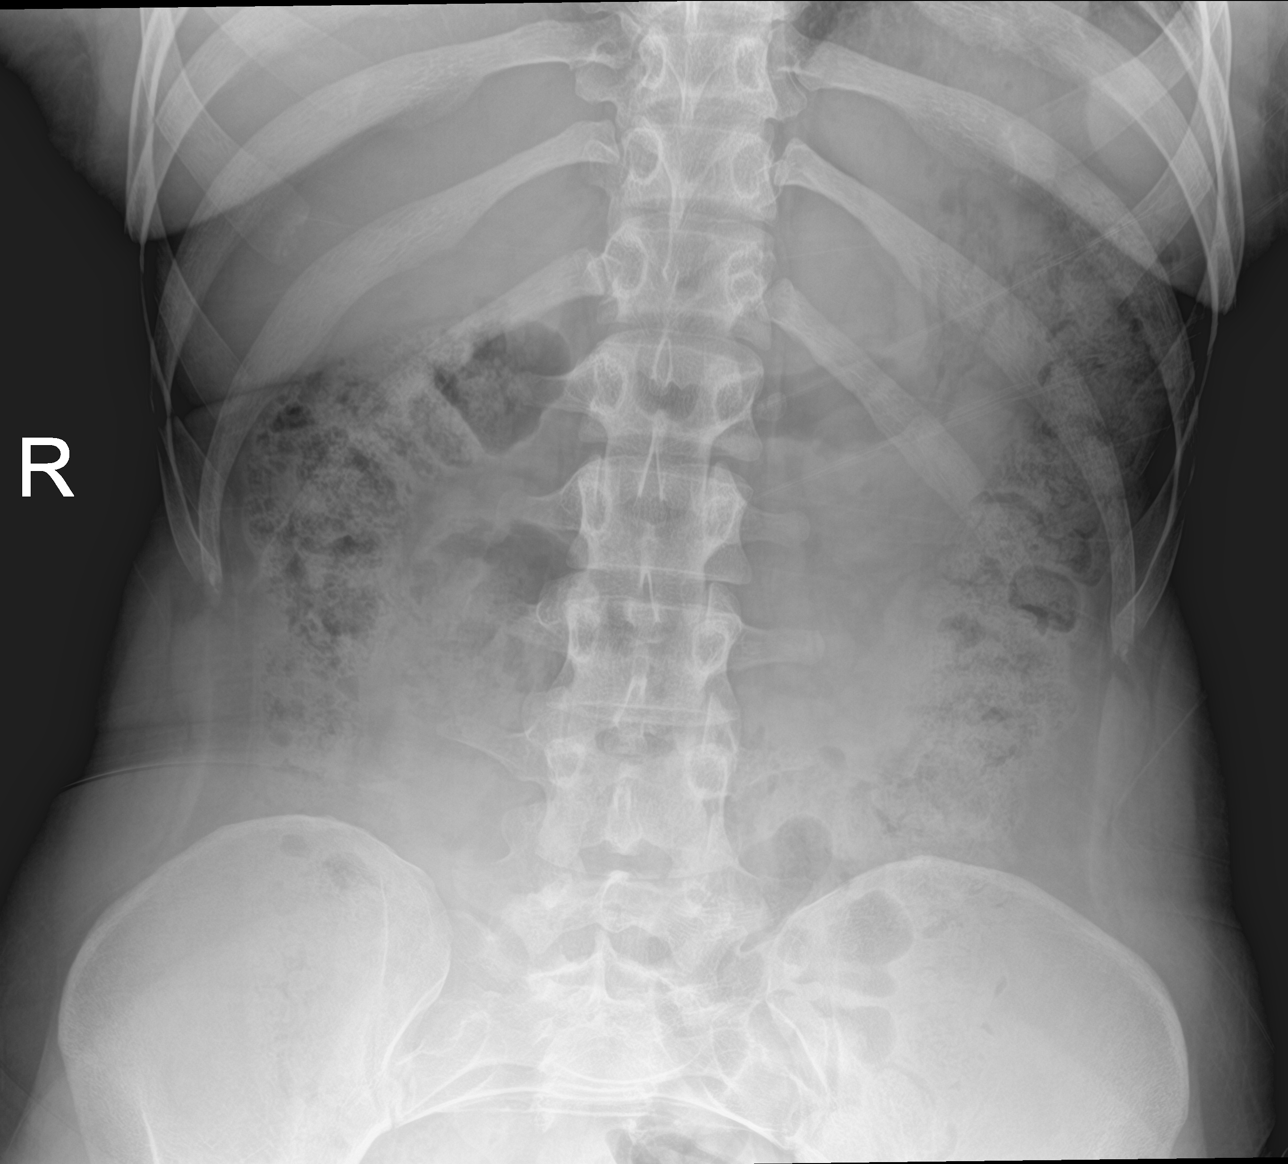

[3 of 3 positions shown; findings below may reference images not displayed]

FINDINGS: No dilated large or small bowel. Moderate volume stool in the
ascending and descending colon. No bowel dilatation. Gas in the
rectum. No pathologic calcifications. No organomegaly. No acute
osseous findings.
IMPRESSION: No acute abdominal findings.  Moderate volume stool

## 2020-12-01 MED FILL — MODAFINIL 200 MG TABLET: 200 | 30 days supply | Qty: 60 | Fill #1

## 2021-01-13 DIAGNOSIS — Z20822 Contact with and (suspected) exposure to covid-19: Secondary | ICD-10-CM | POA: Diagnosis not present

## 2021-02-01 IMAGING — CT CT ABD-PELV W/ CM
2 of 4 series · 16 of 46 positions shown, 18 images · IV contrast (Omnipaque)
Comparison: None.

CLINICAL DATA: Abdominal pain for 2 months

EXAM:
CT ABDOMEN AND PELVIS WITH CONTRAST
TECHNIQUE: Multidetector CT imaging of the abdomen and pelvis was performed
using the standard protocol following bolus administration of
intravenous contrast.
CONTRAST:  100mL OMNIPAQUE IOHEXOL 300 MG/ML  SOLN

[Series 2: axial st · axial · 0.72mm/px · z∈[+343,+768]mm · 13 of 93 slices shown, 15 images]
[im 4/93  soft-tissue]
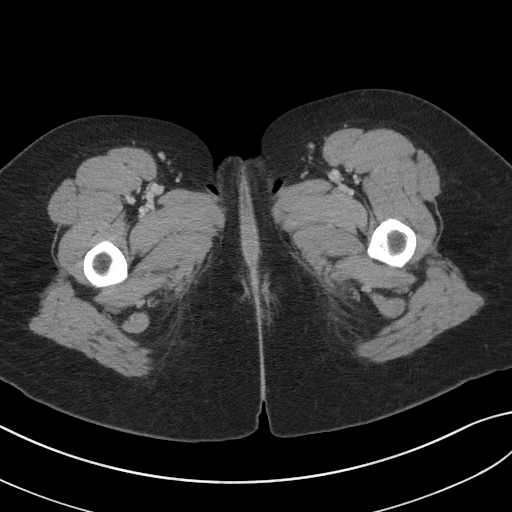
[im 4/93  bone]
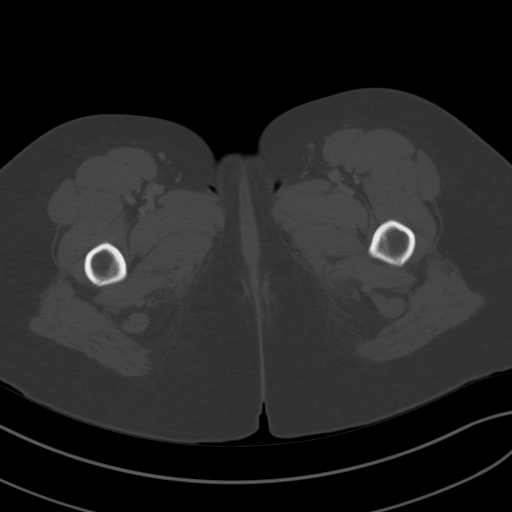
[im 11/93  soft-tissue]
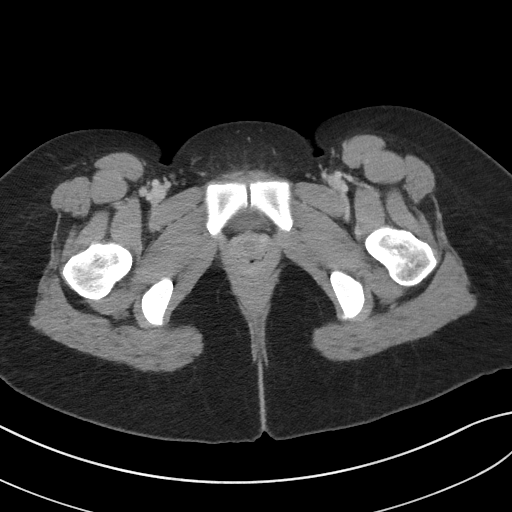
[im 18/93  soft-tissue]
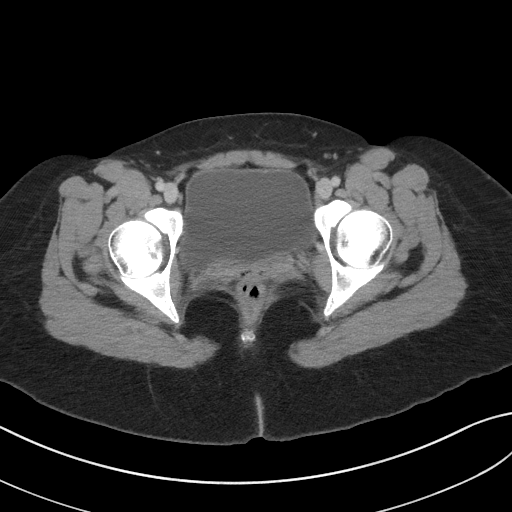
[im 25/93  soft-tissue]
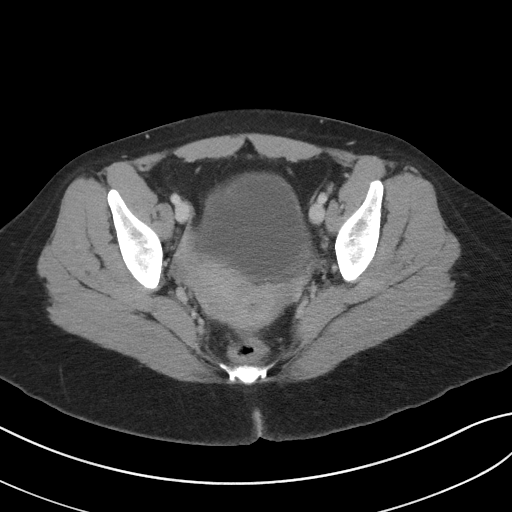
[im 32/93  soft-tissue]
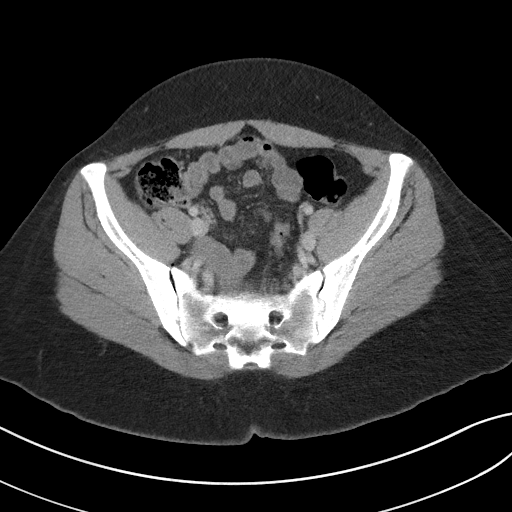
[im 39/93  soft-tissue]
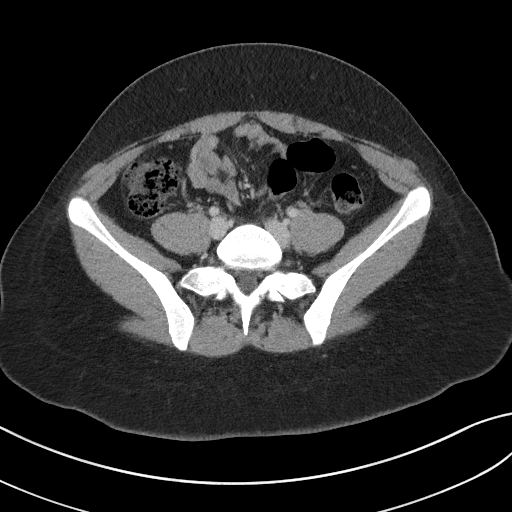
[im 47/93  soft-tissue]
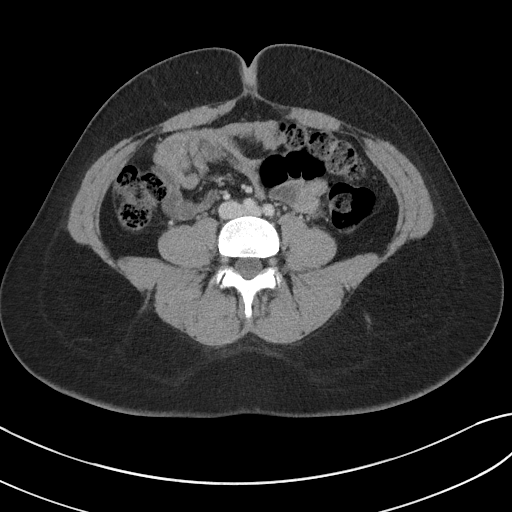
[im 54/93  soft-tissue]
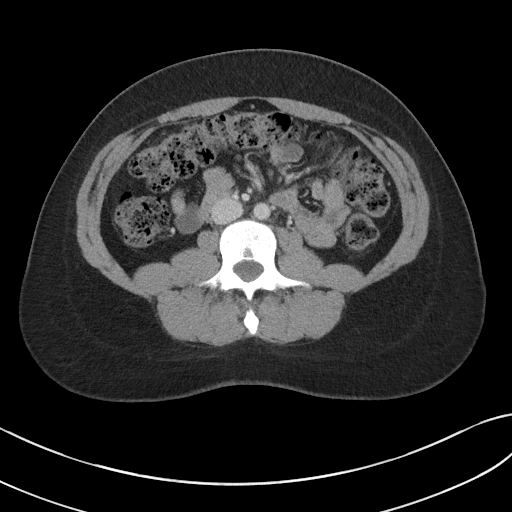
[im 61/93  soft-tissue]
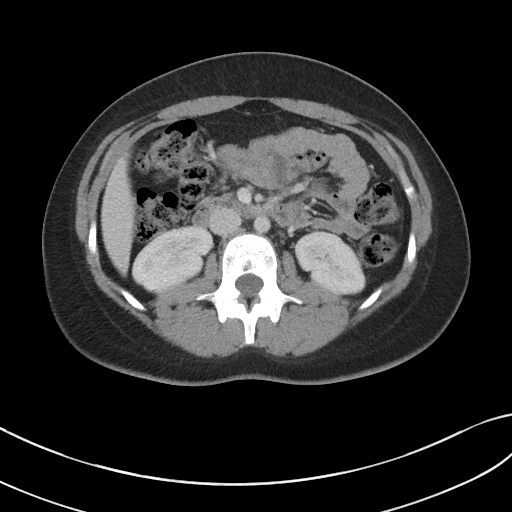
[im 61/93  bone]
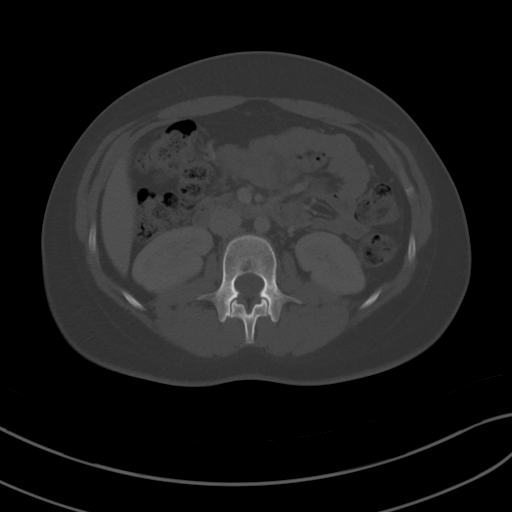
[im 68/93  soft-tissue]
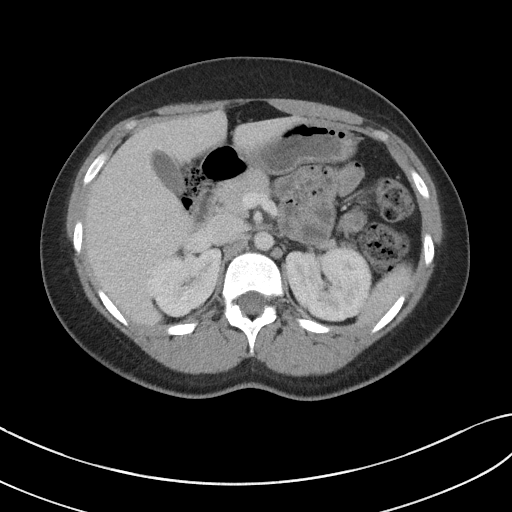
[im 75/93  soft-tissue]
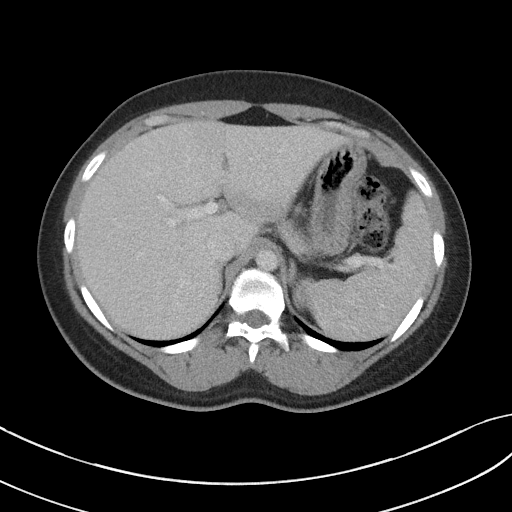
[im 82/93  soft-tissue]
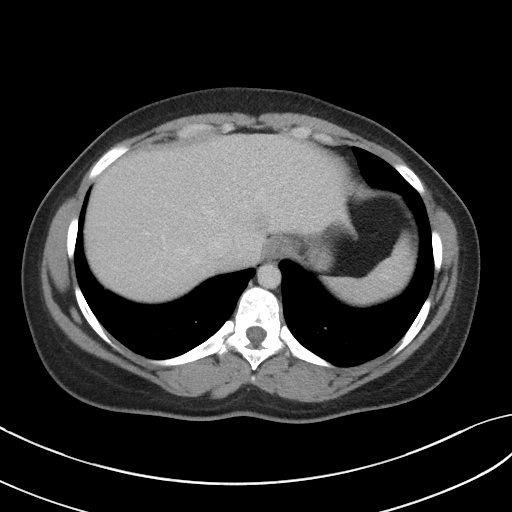
[im 89/93  soft-tissue]
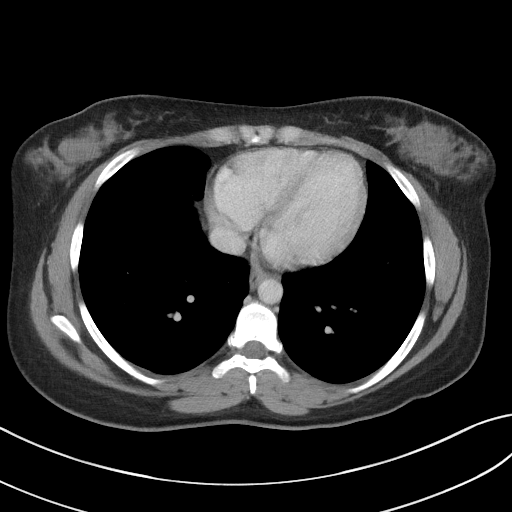

[Series 5: coronal st · coronal · 0.69mm/px · 3 of 83 slices shown]
[im 28/83  soft-tissue]
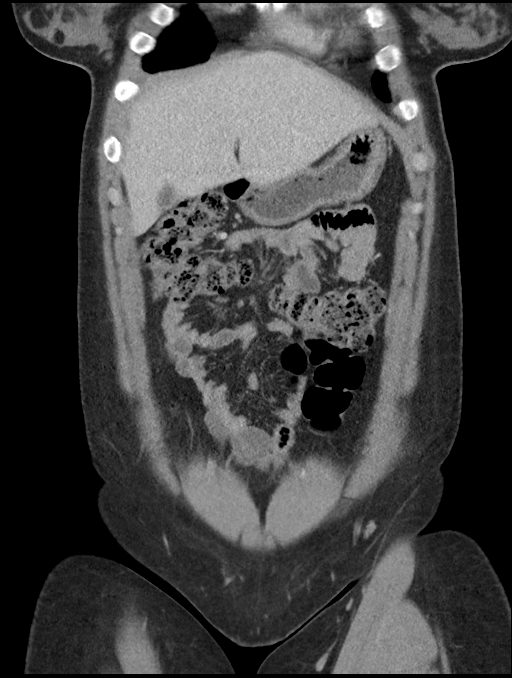
[im 37/83  soft-tissue]
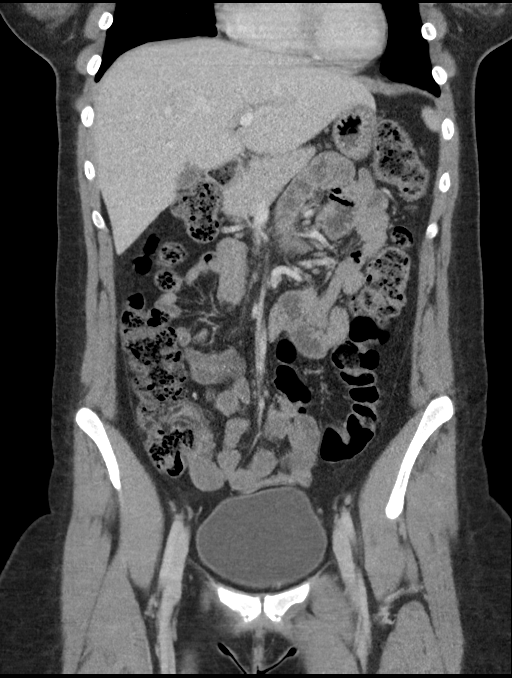
[im 46/83  soft-tissue]
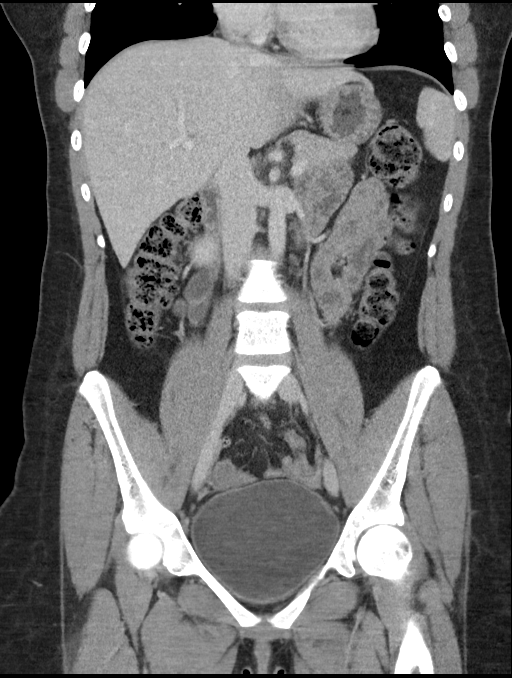

[16 of 46 positions shown; findings below may reference images not displayed]

FINDINGS: Lower chest: No acute abnormality noted.

Hepatobiliary: Gallbladder is decompressed. The liver demonstrates a
geographic area of decreased attenuation within the posterior aspect
of the lateral segment of the left lobe of the liver. This likely
represents focal fatty infiltration. No other focal mass is seen.

Pancreas: Unremarkable. No pancreatic ductal dilatation or
surrounding inflammatory changes.

Spleen: Normal in size without focal abnormality.

Adrenals/Urinary Tract: Adrenal glands are within normal limits.
Kidneys are well visualized bilaterally. No renal calculi or
obstructive changes are seen. The bladder is partially distended.

Stomach/Bowel: The appendix is within normal limits. No obstructive
or inflammatory changes of the colon are noted. Small bowel is
within normal limits. Stomach is unremarkable.

Vascular/Lymphatic: No significant vascular findings are present. No
enlarged abdominal or pelvic lymph nodes.

Reproductive: Uterus and bilateral adnexa are unremarkable.

Other: No abdominal wall hernia or abnormality. No abdominopelvic
ascites.

Musculoskeletal: No acute or significant osseous findings.
IMPRESSION: Geographic area of decreased attenuation in the posterior aspect of
the lateral segment of the left lobe of the liver. This likely
represents focal fatty infiltration.

No other focal abnormality is seen.

## 2021-04-07 ENCOUNTER — Ambulatory Visit: Payer: 59 | Admitting: Family

## 2021-04-07 NOTE — Progress Notes (Incomplete)
Subjective:   By signing my name below, I, Shehryar Baig, attest that this documentation has been prepared under the direction and in the presence of Sandford Craze NP. 04/07/2021      Patient ID: Dorothy [name], female    DOB: 1996-06-06, 25 y.o.   MRN: [id number]  No chief complaint on file.   HPI Patient is in today for a office visit.     Health Maintenance Due  Topic Date Due  . HPV VACCINES (3 - 3-dose series) 08/19/2020  . COVID-19 Vaccine (3 - Booster for Pfizer series) 03/21/2021    Past Medical History:  Diagnosis Date  . Anemia   . Narcolepsy   . Sleep apnea    Uses a CPAP     No past surgical history on file.  Family History  Problem Relation Age of Onset  . Learning disabilities Mother   . Diabetes Father   . Rheum arthritis Sister   . Diabetes Maternal Grandmother   . Stroke Maternal Grandmother   . Heart disease Maternal Grandfather   . Diabetes Paternal Grandmother   . Cancer Paternal Grandfather        unknown  . Colon cancer Neg Hx   . Esophageal cancer Neg Hx   . Rectal cancer Neg Hx   . Stomach cancer Neg Hx     Social History   Socioeconomic History  . Marital status: Single    Spouse name: Not on file  . Number of children: Not on file  . Years of education: Not on file  . Highest education level: Not on file  Occupational History  . Not on file  Tobacco Use  . Smoking status: Never Smoker  . Smokeless tobacco: Never Used  Vaping Use  . Vaping Use: Never used  Substance and Sexual Activity  . Alcohol use: Yes  . Drug use: No  . Sexual activity: Not Currently    Birth control/protection: None  Other Topics Concern  . Not on file  Social History Narrative   Lives with mom and her younger sister   Will begin at [employer]- wants to do forensic psychology   Enjoys reading, music, color, spend time with family     Social Determinants of Health   Financial Resource Strain: Not on file  Food Insecurity: Not on  file  Transportation Needs: Not on file  Physical Activity: Not on file  Stress: Not on file  Social Connections: Not on file  Intimate Partner Violence: Not on file    Outpatient Medications Prior to Visit  Medication Sig Dispense Refill  . COVID-19 mRNA vaccine, Pfizer, 30 MCG/0.3ML injection INJECT AS DIRECTED .3 mL 0  . fluconazole (DIFLUCAN) 150 MG tablet TAKE 1 TABLET BY MOUTH TODAY AND THEN ONE TABLET IN 3 DAYS IF SYMPTOMS ARE NOT RESOLVED 2 tablet 0  . metroNIDAZOLE (METROGEL) 0.75 % vaginal gel PLACE 1 APPLICATORFUL VAGINALLY AT BEDTIME FOR 5 DAYS. 70 g 0  . modafinil (PROVIGIL) 200 MG tablet 1 tab by mouth in the AM and 1/2 to 1 tab by mouth at noon    . Multiple Vitamins-Minerals (MULTIVITAMIN WITH MINERALS) tablet Take 1 tablet by mouth daily.     No facility-administered medications prior to visit.    No Known Allergies  ROS     Objective:    Physical Exam Constitutional:      General: She is not in acute distress.    Appearance: Normal appearance. She is not ill-appearing.  HENT:  Head: Normocephalic and atraumatic.     Right Ear: External ear normal.     Left Ear: External ear normal.  Eyes:     Extraocular Movements: Extraocular movements intact.     Pupils: Pupils are equal, round, and reactive to light.  Cardiovascular:     Rate and Rhythm: Normal rate and regular rhythm.     Pulses: Normal pulses.     Heart sounds: Normal heart sounds. No murmur heard. No gallop.   Pulmonary:     Effort: Pulmonary effort is normal. No respiratory distress.     Breath sounds: Normal breath sounds. No wheezing, rhonchi or rales.  Skin:    General: Skin is warm and dry.  Neurological:     Mental Status: She is alert and oriented to person, place, and time.  Psychiatric:        Behavior: Behavior normal.     There were no vitals taken for this visit. Wt Readings from Last 3 Encounters:  10/21/20 187 lb (84.8 kg)  06/06/20 187 lb (84.8 kg)  05/03/20 184 lb  (83.5 kg)       Assessment & Plan:   Problem List Items Addressed This Visit   None      No orders of the defined types were placed in this encounter.   I, Sandford Craze NP, personally preformed the services described in this documentation.  All medical record entries made by the scribe were at my direction and in my presence.  I have reviewed the chart and discharge instructions (if applicable) and agree that the record reflects my personal performance and is accurate and complete. 04/07/2021   I,Shehryar Baig,acting as a Neurosurgeon for Lemont Fillers, NP.,have documented all relevant documentation on the behalf of Lemont Fillers, NP,as directed by  Lemont Fillers, NP while in the presence of Lemont Fillers, NP.   [employer]hryar [employer]

## 2021-04-14 ENCOUNTER — Encounter: Payer: Self-pay | Admitting: Family

## 2021-04-14 ENCOUNTER — Ambulatory Visit: Payer: 59 | Admitting: Family

## 2021-04-14 ENCOUNTER — Other Ambulatory Visit (HOSPITAL_COMMUNITY)
Admission: RE | Admit: 2021-04-14 | Discharge: 2021-04-14 | Disposition: A | Discharge status: Home / Self Care | Payer: 59 | Source: Ambulatory Visit | Attending: Family | Admitting: Family

## 2021-04-14 ENCOUNTER — Other Ambulatory Visit: Payer: Self-pay

## 2021-04-14 VITALS — BP 102/63 | HR 67 | Temp 98.4°F | Resp 16 | Wt 190.0 lb

## 2021-04-14 DIAGNOSIS — R102 Pelvic and perineal pain: Secondary | ICD-10-CM | POA: Diagnosis not present

## 2021-04-14 DIAGNOSIS — N760 Acute vaginitis: Secondary | ICD-10-CM

## 2021-04-14 LAB — POC URINALSYSI DIPSTICK (AUTOMATED)
Bilirubin, UA: NEGATIVE
Blood, UA: NEGATIVE
Glucose, UA: NEGATIVE
Ketones, UA: NEGATIVE
Leukocytes, UA: NEGATIVE
Nitrite, UA: NEGATIVE
Protein, UA: POSITIVE — AB
Spec Grav, UA: 1.02 (ref 1.010–1.025)
Urobilinogen, UA: NEGATIVE E.U./dL — AB
pH, UA: 6 (ref 5.0–8.0)

## 2021-04-14 LAB — POCT URINE PREGNANCY: Preg Test, Ur: NEGATIVE

## 2021-04-14 NOTE — Progress Notes (Addendum)
Subjective:   By signing my name below, I, Shehryar Baig, attest that this documentation has been prepared under the direction and in the presence of Sandford Craze NP. 04/14/2021    Patient ID: Dorothy [name], female    DOB: 1996/02/20, 25 y.o.   MRN: [id number]  Chief Complaint  Patient presents with   STD Check    Here to be tested for STD    Vaginal Discharge    Complains of vaginal discharge with some pain    HPI  Patient is in today for an office visit. She is requesting STD screening. She reports having vaginal discharge. After her last visit her symptoms lessened but shortly returned. We treated her back in December for BV and Yeast. She has had a new sexual partner since April.  Discharge has been thick and white. Notes some lower abdominal cramping as well as some mild nausea.  She did have her period this past month.    Health Maintenance Due  Topic Date Due   HPV VACCINES (3 - 3-dose series) 08/19/2020   COVID-19 Vaccine (3 - Booster for Pfizer series) 03/21/2021    Past Medical History:  Diagnosis Date   Anemia    Narcolepsy    Sleep apnea    Uses a CPAP     No past surgical history on file.  Family History  Problem Relation Age of Onset   Learning disabilities Mother    Diabetes Father    Rheum arthritis Sister    Diabetes Maternal Grandmother    Stroke Maternal Grandmother    Heart disease Maternal Grandfather    Diabetes Paternal Grandmother    Cancer Paternal Grandfather        unknown   Colon cancer Neg Hx    Esophageal cancer Neg Hx    Rectal cancer Neg Hx    Stomach cancer Neg Hx     Social History   Socioeconomic History   Marital status: Single    Spouse name: Not on file   Number of children: Not on file   Years of education: Not on file   Highest education level: Not on file  Occupational History   Not on file  Tobacco Use   Smoking status: Never   Smokeless tobacco: Never  Vaping Use   Vaping Use: Never used   Substance and Sexual Activity   Alcohol use: Yes   Drug use: No   Sexual activity: Not Currently    Birth control/protection: None  Other Topics Concern   Not on file  Social History Narrative   Lives with mom and her younger sister   Will begin at [employer]- wants to do forensic psychology   Enjoys reading, music, color, spend time with family     Social Determinants of Health   Financial Resource Strain: Not on file  Food Insecurity: Not on file  Transportation Needs: Not on file  Physical Activity: Not on file  Stress: Not on file  Social Connections: Not on file  Intimate Partner Violence: Not on file    Outpatient Medications Prior to Visit  Medication Sig Dispense Refill   modafinil (PROVIGIL) 200 MG tablet 1 tab by mouth in the AM and 1/2 to 1 tab by mouth at noon     Multiple Vitamins-Minerals (MULTIVITAMIN WITH MINERALS) tablet Take 1 tablet by mouth daily.     COVID-19 mRNA vaccine, Pfizer, 30 MCG/0.3ML injection INJECT AS DIRECTED .3 mL 0   fluconazole (DIFLUCAN) 150 MG tablet  TAKE 1 TABLET BY MOUTH TODAY AND THEN ONE TABLET IN 3 DAYS IF SYMPTOMS ARE NOT RESOLVED 2 tablet 0   metroNIDAZOLE (METROGEL) 0.75 % vaginal gel PLACE 1 APPLICATORFUL VAGINALLY AT BEDTIME FOR 5 DAYS. 70 g 0   No facility-administered medications prior to visit.    No Known Allergies  Review of Systems  Gastrointestinal:        (+)vaginal discharge      Objective:    Physical Exam Exam conducted with a chaperone present.  Constitutional:      General: She is not in acute distress.    Appearance: Normal appearance. She is not ill-appearing.  HENT:     Head: Normocephalic and atraumatic.     Right Ear: External ear normal.     Left Ear: External ear normal.  Eyes:     General: No scleral icterus. Cardiovascular:     Rate and Rhythm: Normal rate and regular rhythm.  Pulmonary:     Effort: Pulmonary effort is normal.  Abdominal:     General: There is no distension.      Palpations: Abdomen is soft. There is no mass.  Genitourinary:    General: Normal vulva.     Exam position: Lithotomy position.     Pubic Area: No rash.      Labia:        Right: No rash.        Left: No rash.      Comments: No adnexal masses, no CMT Skin:    General: Skin is warm and dry.  Neurological:     Mental Status: She is alert and oriented to person, place, and time.  Psychiatric:        Behavior: Behavior normal.    BP 102/63 (BP Location: Right Arm, Patient Position: Sitting, Cuff Size: Large)   Pulse 67   Temp 98.4 F (36.9 C) (Oral)   Resp 16   Wt 190 lb (86.2 kg)   SpO2 100%   BMI 30.67 kg/m  Wt Readings from Last 3 Encounters:  04/14/21 190 lb (86.2 kg)  10/21/20 187 lb (84.8 kg)  06/06/20 187 lb (84.8 kg)       Assessment & Plan:   Problem List Items Addressed This Visit       Unprioritized   Pelvic pressure in female    Check urine HCG, UA, urine culture.        Relevant Orders   POCT Urinalysis Dipstick (Automated) (Completed)   Urine Culture   POCT urine pregnancy (Completed)   Acute vaginitis - Primary    New.  Swab will be sent for BV/Yeast/trichomonas/GC/Chlamydia.  Further treatment recommendations pending result review.        Relevant Orders   Cervicovaginal ancillary only( [place])     No orders of the defined types were placed in this encounter.   I, Sandford Craze NP, personally preformed the services described in this documentation.  All medical record entries made by the scribe were at my direction and in my presence.  I have reviewed the chart and discharge instructions (if applicable) and agree that the record reflects my personal performance and is accurate and complete. 04/14/2021   I,Shehryar Baig,acting as a Neurosurgeon for Lemont Fillers, NP.,have documented all relevant documentation on the behalf of Lemont Fillers, NP,as directed by  Lemont Fillers, NP while in the presence of Lemont Fillers, NP.   Lemont Fillers, NP

## 2021-04-14 NOTE — Assessment & Plan Note (Signed)
New.  Swab will be sent for BV/Yeast/trichomonas/GC/Chlamydia.  Further treatment recommendations pending result review.

## 2021-04-14 NOTE — Patient Instructions (Signed)
We will contact you with your results and further recommendations.  °

## 2021-04-14 NOTE — Assessment & Plan Note (Signed)
Check urine HCG, UA, urine culture.

## 2021-04-15 ENCOUNTER — Encounter: Payer: Self-pay | Admitting: Family

## 2021-04-15 DIAGNOSIS — R809 Proteinuria, unspecified: Secondary | ICD-10-CM

## 2021-04-16 LAB — CERVICOVAGINAL ANCILLARY ONLY
Bacterial Vaginitis (gardnerella): NEGATIVE
Candida Glabrata: NEGATIVE
Candida Vaginitis: POSITIVE — AB
Chlamydia: NEGATIVE
Comment: NEGATIVE
Comment: NEGATIVE
Comment: NEGATIVE
Comment: NEGATIVE
Comment: NEGATIVE
Comment: NORMAL
Neisseria Gonorrhea: NEGATIVE
Trichomonas: NEGATIVE

## 2021-04-16 LAB — URINE CULTURE
MICRO NUMBER:: 11993721
SPECIMEN QUALITY:: ADEQUATE

## 2021-04-16 MED ORDER — METRONIDAZOLE 0.75 % VA GEL
1.0000 | Freq: Every day | VAGINAL | 0 refills | Status: AC
  Filled 2021-04-16: qty 70, 7d supply, fill #0

## 2021-04-16 MED ORDER — FLUCONAZOLE 150 MG PO TABS
ORAL_TABLET | ORAL | 0 refills | Status: DC
  Filled 2021-04-16: qty 2, 3d supply, fill #0

## 2021-04-17 ENCOUNTER — Other Ambulatory Visit (HOSPITAL_BASED_OUTPATIENT_CLINIC_OR_DEPARTMENT_OTHER): Payer: Self-pay

## 2021-04-18 ENCOUNTER — Other Ambulatory Visit (INDEPENDENT_AMBULATORY_CARE_PROVIDER_SITE_OTHER): Payer: 59

## 2021-04-18 ENCOUNTER — Other Ambulatory Visit: Payer: Self-pay

## 2021-04-18 DIAGNOSIS — R809 Proteinuria, unspecified: Secondary | ICD-10-CM | POA: Diagnosis not present

## 2021-04-18 LAB — URINALYSIS, ROUTINE W REFLEX MICROSCOPIC
Bilirubin Urine: NEGATIVE
Hgb urine dipstick: NEGATIVE
Ketones, ur: NEGATIVE
Leukocytes,Ua: NEGATIVE
Nitrite: NEGATIVE
Specific Gravity, Urine: 1.01 (ref 1.000–1.030)
Total Protein, Urine: NEGATIVE
Urine Glucose: NEGATIVE
Urobilinogen, UA: 0.2 (ref 0.0–1.0)
pH: 6 (ref 5.0–8.0)

## 2021-04-19 ENCOUNTER — Telehealth: Payer: Self-pay | Admitting: Family

## 2021-04-19 NOTE — Telephone Encounter (Signed)
Opened in error

## 2021-04-24 ENCOUNTER — Encounter: Payer: 59 | Admitting: Family

## 2021-04-26 ENCOUNTER — Encounter: Payer: 59 | Admitting: Family

## 2021-04-26 NOTE — Progress Notes (Incomplete)
Subjective:   By signing my name below, I, Shehryar Baig, attest that this documentation has been prepared under the direction and in the presence of Sandford Craze NP. 04/26/2021     Patient ID: Dorothy [name], female    DOB: 05/22/1996, 25 y.o.   MRN: [id number]  No chief complaint on file.   HPI Patient is in today for a comprehensive physical exam.  She denies having any unexpected weight change, hearing loss and rhinorrhea, visual disturbance, cough, chest pain and leg swelling, nausea, vomiting, diarrhea and blood in stool, or dysuria and frequency, for myalgias and arthralgias, rash, headaches, adenopathy, depression or anxiety at this time.   Immunizations: Diet: Exercise: Pap Smear: Last completed 07/01/2019. Results normal. Repeat in 3 years.  Dental: Vision:  Health Maintenance Due  Topic Date Due   HPV VACCINES (3 - 3-dose series) 08/19/2020   COVID-19 Vaccine (3 - Booster for Pfizer series) 03/21/2021    Past Medical History:  Diagnosis Date   Anemia    Narcolepsy    Sleep apnea    Uses a CPAP     No past surgical history on file.  Family History  Problem Relation Age of Onset   Learning disabilities Mother    Diabetes Father    Rheum arthritis Sister    Diabetes Maternal Grandmother    Stroke Maternal Grandmother    Heart disease Maternal Grandfather    Diabetes Paternal Grandmother    Cancer Paternal Grandfather        unknown   Colon cancer Neg Hx    Esophageal cancer Neg Hx    Rectal cancer Neg Hx    Stomach cancer Neg Hx     Social History   Socioeconomic History   Marital status: Single    Spouse name: Not on file   Number of children: Not on file   Years of education: Not on file   Highest education level: Not on file  Occupational History   Not on file  Tobacco Use   Smoking status: Never   Smokeless tobacco: Never  Vaping Use   Vaping Use: Never used  Substance and Sexual Activity   Alcohol use: Yes   Drug use: No    Sexual activity: Not Currently    Birth control/protection: None  Other Topics Concern   Not on file  Social History Narrative   Lives with mom and her younger sister   Will begin at [employer]- wants to do forensic psychology   Enjoys reading, music, color, spend time with family     Social Determinants of Health   Financial Resource Strain: Not on file  Food Insecurity: Not on file  Transportation Needs: Not on file  Physical Activity: Not on file  Stress: Not on file  Social Connections: Not on file  Intimate Partner Violence: Not on file    Outpatient Medications Prior to Visit  Medication Sig Dispense Refill   fluconazole (DIFLUCAN) 150 MG tablet Take 1 tablet by mouth today, may repeat in 3 days if needed 2 tablet 0   metroNIDAZOLE (METROGEL VAGINAL) 0.75 % vaginal gel Place 1 Applicatorful vaginally at bedtime for 5 days. 70 g 0   modafinil (PROVIGIL) 200 MG tablet 1 tab by mouth in the AM and 1/2 to 1 tab by mouth at noon     Multiple Vitamins-Minerals (MULTIVITAMIN WITH MINERALS) tablet Take 1 tablet by mouth daily.     No facility-administered medications prior to visit.    No  Known Allergies  ROS     Objective:    Physical Exam Constitutional:      General: She is not in acute distress.    Appearance: Normal appearance. She is not ill-appearing.  HENT:     Head: Normocephalic and atraumatic.     Right Ear: Tympanic membrane, ear canal and external ear normal.     Left Ear: Tympanic membrane, ear canal and external ear normal.  Eyes:     Extraocular Movements: Extraocular movements intact.     Pupils: Pupils are equal, round, and reactive to light.     Comments: No nystagmus  Cardiovascular:     Rate and Rhythm: Normal rate and regular rhythm.     Pulses: Normal pulses.     Heart sounds: Normal heart sounds. No murmur heard.   No gallop.  Pulmonary:     Effort: Pulmonary effort is normal. No respiratory distress.     Breath sounds: Normal breath  sounds. No wheezing, rhonchi or rales.  Abdominal:     General: Bowel sounds are normal. There is no distension.     Palpations: Abdomen is soft. There is no mass.     Tenderness: There is no abdominal tenderness. There is no guarding or rebound.     Hernia: No hernia is present.  Musculoskeletal:     Comments: 5/5 strength in both upper and lower extremities  Skin:    General: Skin is warm and dry.  Neurological:     Mental Status: She is alert and oriented to person, place, and time.     Deep Tendon Reflexes:     Reflex Scores:      Patellar reflexes are 2+ on the right side and 2+ on the left side. Psychiatric:        Behavior: Behavior normal.    There were no vitals taken for this visit. Wt Readings from Last 3 Encounters:  04/14/21 190 lb (86.2 kg)  10/21/20 187 lb (84.8 kg)  06/06/20 187 lb (84.8 kg)       Assessment & Plan:   Problem List Items Addressed This Visit   None    No orders of the defined types were placed in this encounter.   I, Sandford Craze NP, personally preformed the services described in this documentation.  All medical record entries made by the scribe were at my direction and in my presence.  I have reviewed the chart and discharge instructions (if applicable) and agree that the record reflects my personal performance and is accurate and complete. 04/26/2021   I,Shehryar Baig,acting as a Neurosurgeon for Lemont Fillers, NP.,have documented all relevant documentation on the behalf of Lemont Fillers, NP,as directed by  Lemont Fillers, NP while in the presence of Lemont Fillers, NP.   Shehryar [employer]

## 2021-06-06 ENCOUNTER — Other Ambulatory Visit (HOSPITAL_BASED_OUTPATIENT_CLINIC_OR_DEPARTMENT_OTHER): Payer: Self-pay

## 2021-06-06 MED ORDER — MODAFINIL 200 MG PO TABS
ORAL_TABLET | ORAL | 1 refills | Status: DC
  Filled 2021-06-06 – 2021-06-15 (×2): qty 60, 30d supply, fill #0

## 2021-06-15 ENCOUNTER — Other Ambulatory Visit (HOSPITAL_BASED_OUTPATIENT_CLINIC_OR_DEPARTMENT_OTHER): Payer: Self-pay

## 2021-07-03 DIAGNOSIS — G47411 Narcolepsy with cataplexy: Secondary | ICD-10-CM | POA: Diagnosis not present

## 2021-07-03 DIAGNOSIS — G4733 Obstructive sleep apnea (adult) (pediatric): Secondary | ICD-10-CM | POA: Diagnosis not present

## 2021-09-03 DIAGNOSIS — R040 Epistaxis: Secondary | ICD-10-CM | POA: Diagnosis not present

## 2021-09-03 DIAGNOSIS — R0989 Other specified symptoms and signs involving the circulatory and respiratory systems: Secondary | ICD-10-CM | POA: Diagnosis not present

## 2021-09-03 DIAGNOSIS — J029 Acute pharyngitis, unspecified: Secondary | ICD-10-CM | POA: Diagnosis not present

## 2021-09-03 DIAGNOSIS — Z20822 Contact with and (suspected) exposure to covid-19: Secondary | ICD-10-CM | POA: Diagnosis not present

## 2021-09-03 DIAGNOSIS — J069 Acute upper respiratory infection, unspecified: Secondary | ICD-10-CM | POA: Diagnosis not present

## 2021-09-03 DIAGNOSIS — R059 Cough, unspecified: Secondary | ICD-10-CM | POA: Diagnosis not present

## 2021-09-16 IMAGING — US US AXILLARY RIGHT
1 series · 5 of 5 positions shown · non-contrast
Comparison: None.

CLINICAL DATA: Patient complaining of right axillary swelling.

EXAM:
ULTRASOUND OF THE RIGHT BREAST

[Series 1: us axillary right · 0.07mm/px · 5 of 5 slices shown]
[im 1/5]
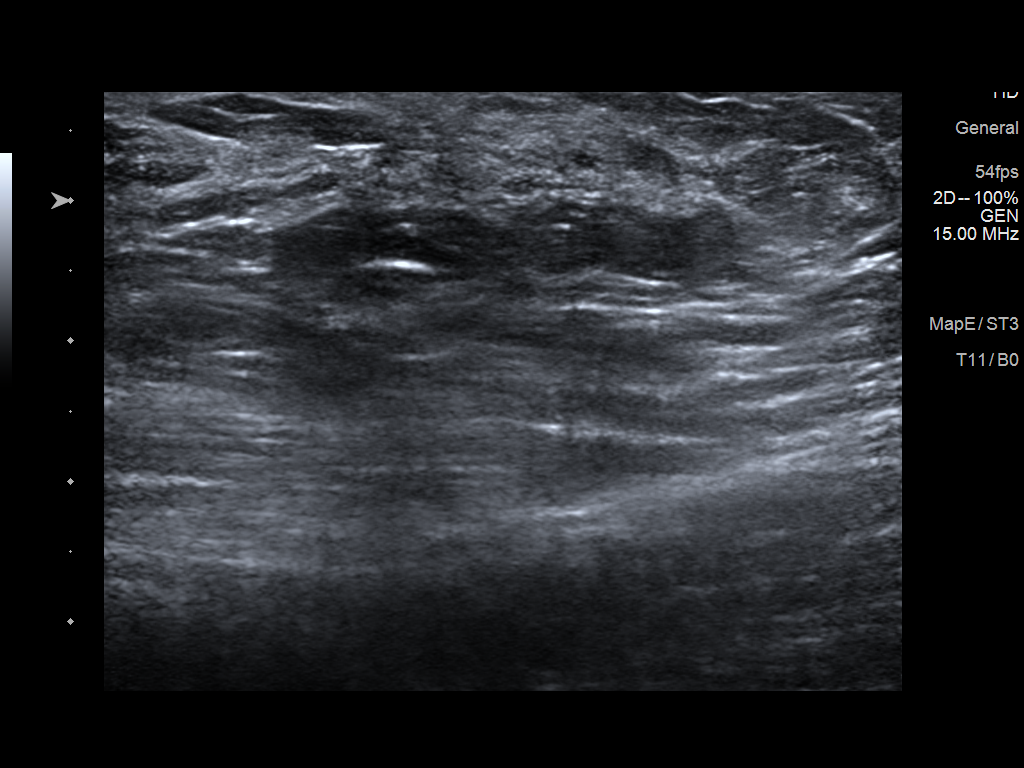
[im 2/5]
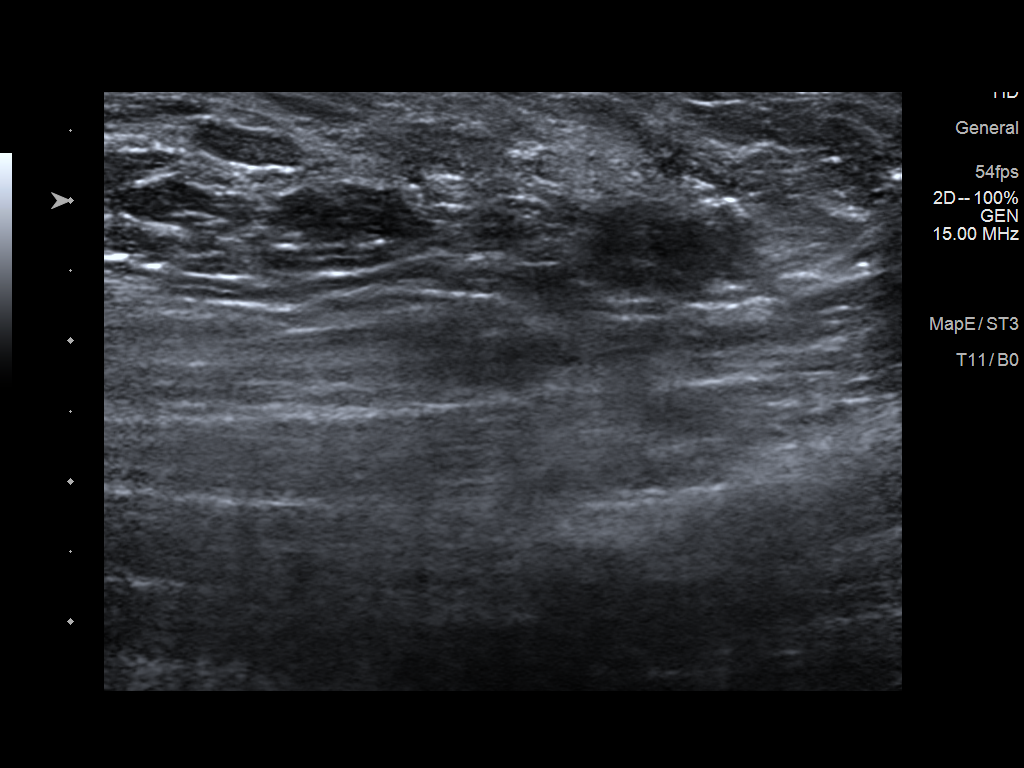
[im 3/5]
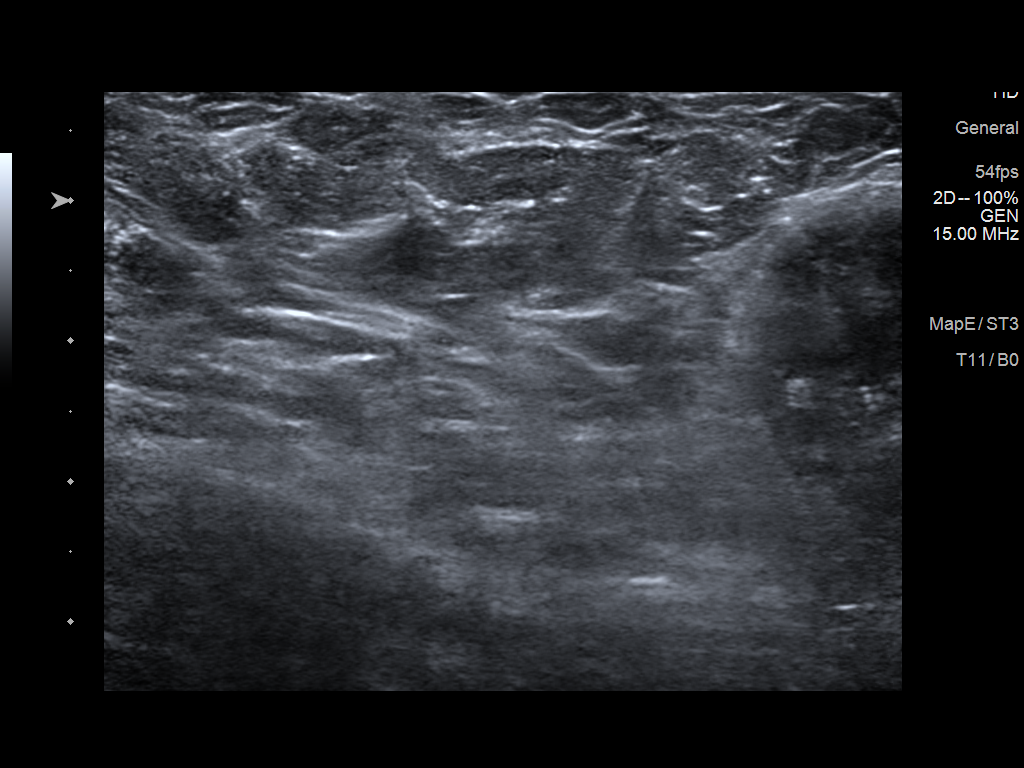
[im 4/5]
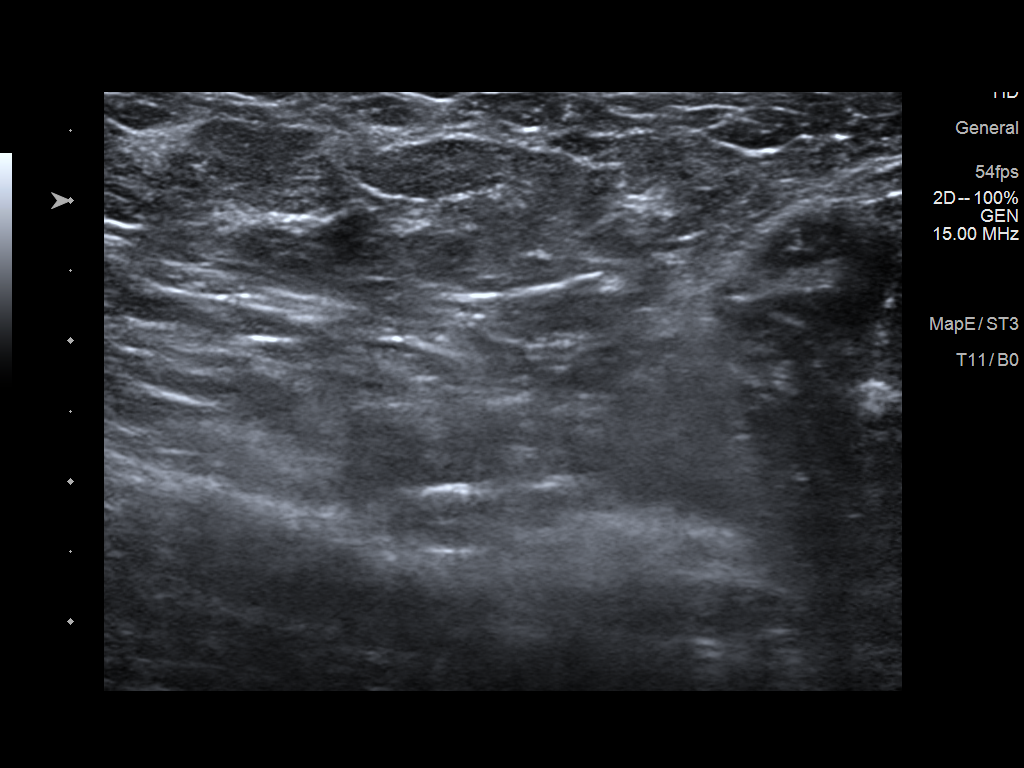
[im 5/5]
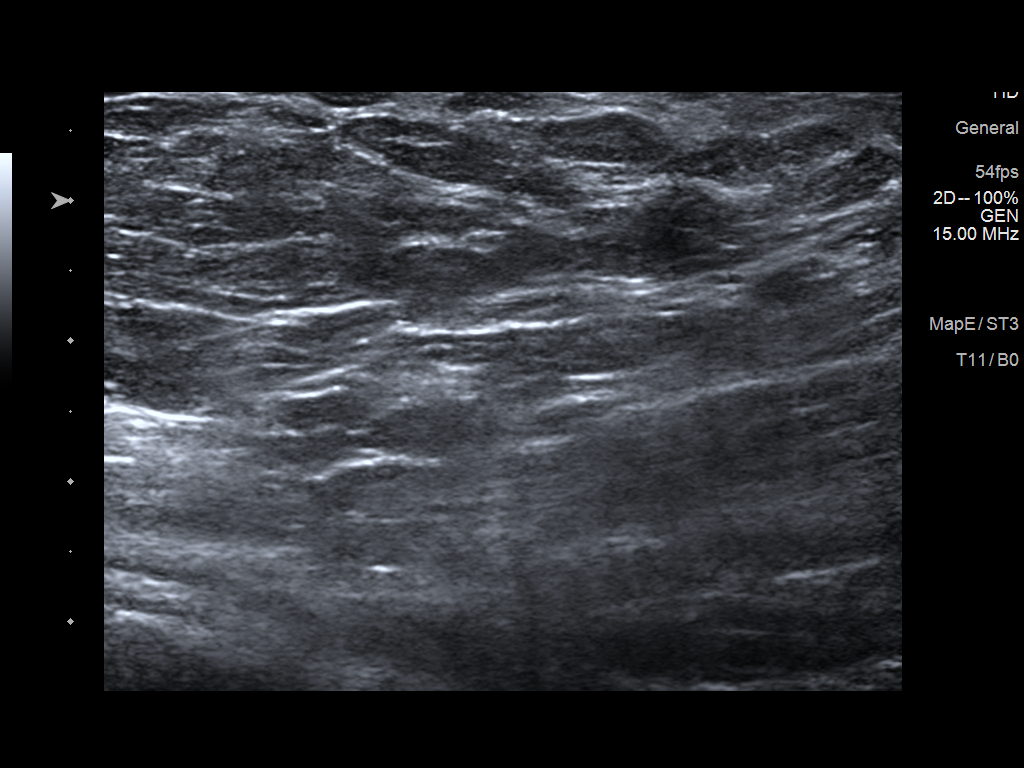

[5 of 5 positions shown; findings below may reference images not displayed]

FINDINGS: On physical exam, there is obvious asymmetry of the axilla, right
clearly enlarged compared to the left, with a bulge of the skin due
to soft underlying prominent tissue consistent with asymmetric
subcutaneous fat. There is no defined mass.

Targeted ultrasound is performed, showing normal tissue with an area
of mixed echogenicity suggesting axillary fibroglandular tissue.
There is no mass. There are no enlarged or abnormal lymph nodes.
IMPRESSION: 1. No evidence of malignancy. No axillary masses or enlarged lymph
nodes.
2. Right axillary enlargement appears to be due to the asymmetric
deposition of subcutaneous fat. There is no defined lipoma.
Sonography also demonstrate evidence of axillary fibroglandular
tissue.

RECOMMENDATION:
1. Screening mammogram at age 42 unless there are persistent or
intervening clinical concerns. (Code:[id number]-Z-13E)
2. Clinical follow-up for the right axillary enlargement.

I have discussed the findings and recommendations with the patient.
If applicable, a reminder letter will be sent to the patient
regarding the next appointment.

BI-RADS CATEGORY  2: Benign.

## 2022-01-23 ENCOUNTER — Other Ambulatory Visit (HOSPITAL_BASED_OUTPATIENT_CLINIC_OR_DEPARTMENT_OTHER): Payer: Self-pay

## 2022-01-23 MED ORDER — MODAFINIL 200 MG PO TABS
ORAL_TABLET | ORAL | 3 refills | Status: DC
  Filled 2022-01-23: qty 60, 30d supply, fill #0

## 2022-03-07 ENCOUNTER — Other Ambulatory Visit (HOSPITAL_COMMUNITY)
Admission: RE | Admit: 2022-03-07 | Discharge: 2022-03-07 | Disposition: A | Discharge status: Home / Self Care | Payer: 59 | Source: Ambulatory Visit | Attending: Family | Admitting: Family

## 2022-03-07 DIAGNOSIS — Z01419 Encounter for gynecological examination (general) (routine) without abnormal findings: Secondary | ICD-10-CM | POA: Insufficient documentation

## 2022-03-07 DIAGNOSIS — N898 Other specified noninflammatory disorders of vagina: Secondary | ICD-10-CM | POA: Insufficient documentation

## 2022-04-04 ENCOUNTER — Other Ambulatory Visit (HOSPITAL_BASED_OUTPATIENT_CLINIC_OR_DEPARTMENT_OTHER): Payer: Self-pay

## 2022-04-04 ENCOUNTER — Encounter: Payer: Self-pay | Admitting: Family

## 2022-04-04 ENCOUNTER — Ambulatory Visit (INDEPENDENT_AMBULATORY_CARE_PROVIDER_SITE_OTHER): Payer: 59 | Admitting: Family

## 2022-04-04 VITALS — BP 111/74 | HR 78 | Temp 97.9°F | Resp 16 | Ht 65.0 in | Wt 198.0 lb

## 2022-04-04 DIAGNOSIS — E669 Obesity, unspecified: Secondary | ICD-10-CM

## 2022-04-04 DIAGNOSIS — Z8742 Personal history of other diseases of the female genital tract: Secondary | ICD-10-CM

## 2022-04-04 DIAGNOSIS — R110 Nausea: Secondary | ICD-10-CM | POA: Diagnosis not present

## 2022-04-04 DIAGNOSIS — Z01419 Encounter for gynecological examination (general) (routine) without abnormal findings: Secondary | ICD-10-CM | POA: Diagnosis not present

## 2022-04-04 DIAGNOSIS — Z833 Family history of diabetes mellitus: Secondary | ICD-10-CM | POA: Diagnosis not present

## 2022-04-04 DIAGNOSIS — Z0000 Encounter for general adult medical examination without abnormal findings: Secondary | ICD-10-CM

## 2022-04-04 DIAGNOSIS — R635 Abnormal weight gain: Secondary | ICD-10-CM | POA: Diagnosis not present

## 2022-04-04 DIAGNOSIS — N898 Other specified noninflammatory disorders of vagina: Secondary | ICD-10-CM

## 2022-04-04 DIAGNOSIS — E66811 Obesity, class 1: Secondary | ICD-10-CM

## 2022-04-04 DIAGNOSIS — Z0001 Encounter for general adult medical examination with abnormal findings: Secondary | ICD-10-CM

## 2022-04-04 LAB — CBC WITH DIFFERENTIAL/PLATELET
Basophils Absolute: 0 10*3/uL (ref 0.0–0.1)
Basophils Relative: 0.4 % (ref 0.0–3.0)
Eosinophils Absolute: 0 10*3/uL (ref 0.0–0.7)
Eosinophils Relative: 0.9 % (ref 0.0–5.0)
HCT: 38 % (ref 36.0–46.0)
Hemoglobin: 12.6 g/dL (ref 12.0–15.0)
Lymphocytes Relative: 44 % (ref 12.0–46.0)
Lymphs Abs: 1.8 10*3/uL (ref 0.7–4.0)
MCHC: 33.1 g/dL (ref 30.0–36.0)
MCV: 88.8 fl (ref 78.0–100.0)
Monocytes Absolute: 0.3 10*3/uL (ref 0.1–1.0)
Monocytes Relative: 7.3 % (ref 3.0–12.0)
Neutro Abs: 1.9 10*3/uL (ref 1.4–7.7)
Neutrophils Relative %: 47.4 % (ref 43.0–77.0)
Platelets: 243 10*3/uL (ref 150.0–400.0)
RBC: 4.28 Mil/uL (ref 3.87–5.11)
RDW: 13.8 % (ref 11.5–15.5)
WBC: 4.1 10*3/uL (ref 4.0–10.5)

## 2022-04-04 LAB — COMPREHENSIVE METABOLIC PANEL
ALT: 18 U/L (ref 0–35)
AST: 21 U/L (ref 0–37)
Albumin: 4.6 g/dL (ref 3.5–5.2)
Alkaline Phosphatase: 101 U/L (ref 39–117)
BUN: 7 mg/dL (ref 6–23)
CO2: 27 mEq/L (ref 19–32)
Calcium: 10.3 mg/dL (ref 8.4–10.5)
Chloride: 104 mEq/L (ref 96–112)
Creatinine, Ser: 0.82 mg/dL (ref 0.40–1.20)
GFR: 100.77 mL/min (ref >60.00)
Glucose, Bld: 93 mg/dL (ref 70–99)
Potassium: 4.8 mEq/L (ref 3.5–5.1)
Sodium: 139 mEq/L (ref 135–145)
Total Bilirubin: 0.4 mg/dL (ref 0.2–1.2)
Total Protein: 7.9 g/dL (ref 6.0–8.3)

## 2022-04-04 LAB — LIPID PANEL
Cholesterol: 228 mg/dL — ABNORMAL HIGH (ref 0–200)
HDL: 60.5 mg/dL (ref >39.00)
LDL Cholesterol: 153 mg/dL — ABNORMAL HIGH (ref 0–99)
NonHDL: 167.47
Total CHOL/HDL Ratio: 4
Triglycerides: 70 mg/dL (ref 0.0–149.0)
VLDL: 14 mg/dL (ref 0.0–40.0)

## 2022-04-04 LAB — TSH: TSH: 2.47 u[IU]/mL (ref 0.35–5.50)

## 2022-04-04 MED ORDER — OMEPRAZOLE 40 MG PO CPDR
40.0000 mg | DELAYED_RELEASE_CAPSULE | Freq: Every day | ORAL | 3 refills | Status: DC
  Filled 2022-04-04: qty 30, 30d supply, fill #0

## 2022-04-04 NOTE — Assessment & Plan Note (Addendum)
Pap performed today. Discussed healthy diet, exercise, weight loss. Labs as ordered.  Recommended flu shot and covid booster this fall. In terms of contraception, GYN told her OCP's would not be effective with provigil. She is considering Nexplanon or IUD. Will let me know if she needs referral.

## 2022-04-04 NOTE — Patient Instructions (Signed)
Continue your work on healthy diet, regular exercise and weight loss. Please schedule routine vision and dental visits.

## 2022-04-04 NOTE — Assessment & Plan Note (Signed)
?   GERD related. Will give trial of PPI.  (omeprazole).

## 2022-04-04 NOTE — Progress Notes (Signed)
Subjective:     Patient ID: Dorothy [name], female    DOB: Jan 11, 1996, 26 y.o.   MRN: [id number]  Chief Complaint  Patient presents with  . Annual Exam    HPI  Patient presents today for complete physical.   Immunizations: tetanus up to date Diet: reports that she sometimes as a poor appetite.  Some post prandial nausea.  Wt Readings from Last 3 Encounters:  04/04/22 198 lb (89.8 kg)  04/14/21 190 lb (86.2 kg)  10/21/20 187 lb (84.8 kg)  Exercise: walks a lot at school  Pap Smear: 07/01/19- due Vision: due Dental: due Not currently on birth control.   Occasional vaginal itching.  Has had issues with BV.     Health Maintenance Due  Topic Date Due  . COVID-19 Vaccine (3 - Booster for [employer] series) 12/16/2020    Past Medical History:  Diagnosis Date  . Anemia   . Narcolepsy   . Sleep apnea    Uses a CPAP     History reviewed. No pertinent surgical history.  Family History  Problem Relation Age of Onset  . Learning disabilities Mother   . Diabetes Father   . Rheum arthritis Sister   . Diabetes Maternal Grandmother   . Stroke Maternal Grandmother   . Heart disease Maternal Grandfather   . Diabetes Paternal Grandmother   . Cancer Paternal Grandfather        unknown  . Colon cancer Neg Hx   . Esophageal cancer Neg Hx   . Rectal cancer Neg Hx   . Stomach cancer Neg Hx     Social History   Socioeconomic History  . Marital status: Single    Spouse name: Not on file  . Number of children: Not on file  . Years of education: Not on file  . Highest education level: Not on file  Occupational History  . Not on file  Tobacco Use  . Smoking status: Never  . Smokeless tobacco: Never  Vaping Use  . Vaping Use: Never used  Substance and Sexual Activity  . Alcohol use: Yes    Comment: occasional 1-2  . Drug use: No  . Sexual activity: Not Currently    Birth control/protection: None  Other Topics Concern  . 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 modafinil (PROVIGIL) 200 MG tablet 1 tab by mouth in the AM and 1/2 to 1 tab by mouth at noon    . Multiple Vitamins-Minerals (MULTIVITAMIN WITH MINERALS) tablet Take 1 tablet by mouth daily.    . fluconazole (DIFLUCAN) 150 MG tablet Take 1 tablet by mouth today, may repeat in 3 days if needed 2 tablet 0  . modafinil (PROVIGIL) 200 MG tablet TAKE 1 TABLET BY MOUTH EVERY MORNING AND 1/2 TO 1 TABLET AT NOON 60 tablet 1  . modafinil (PROVIGIL) 200 MG tablet TAKE 1 TABLET BY MOUTH EVERY MORNING AND 1/2 TO 1 TABLET AT NOON 60  tablet 3   No facility-administered medications prior to visit.    No Known Allergies  Review of Systems  Constitutional:  Negative for weight loss.  HENT:  Negative for congestion and hearing loss.   Eyes:  Negative for blurred vision.  Respiratory:  Negative for cough.   Cardiovascular:  Negative for leg swelling.  Gastrointestinal:  Positive for constipation and nausea.  Genitourinary:  Negative for dysuria and frequency.  Musculoskeletal:  Negative for joint pain and myalgias.  Neurological:  Negative for headaches.  Psychiatric/Behavioral:         Denies depression/anxiety      Objective:    Physical Exam  BP 111/74 (BP Location: Right Arm, Patient Position: Sitting, Cuff Size: Small)   Pulse 78   Temp 97.9 F (36.6 C) (Oral)   Resp 16   Ht 5\' 5"  (1.651 m)   Wt 198 lb (89.8 kg)   SpO2 99%   BMI 32.95 kg/m  Wt Readings from Last 3 Encounters:  04/04/22 198 lb (89.8  kg)  04/14/21 190 lb (86.2 kg)  10/21/20 187 lb (84.8 kg)   Physical Exam  Constitutional: She is oriented to person, place, and time. She appears well-developed and well-nourished. No distress.  HENT:  Head: Normocephalic and atraumatic.  Right Ear: Tympanic membrane and ear canal normal.  Left Ear: Tympanic membrane and ear canal normal.  Mouth/Throat: deferred patient wearing mask Eyes: Pupils are equal, round, and reactive to light. No scleral icterus.  Neck: Normal range of motion. No thyromegaly present.  Cardiovascular: Normal rate and regular rhythm.   No murmur heard. Pulmonary/Chest: Effort normal and breath sounds normal. No respiratory distress. He has no wheezes. She has no rales. She exhibits no tenderness.  Abdominal: Soft. Bowel sounds are normal. She exhibits no distension and no mass. There is no tenderness. There is no rebound and no guarding.  Musculoskeletal: She exhibits no edema.  Lymphadenopathy:    She has no cervical adenopathy.  Neurological: She is alert and oriented to person, place, and time. She has normal patellar reflexes. She exhibits normal muscle tone. Coordination normal.  Skin: Skin is warm and dry.  Psychiatric: She has a normal mood and affect. Her behavior is normal. Judgment and thought content normal.  Breasts: Examined lying Right: Without masses, retractions, discharge or axillary adenopathy.  Left: Without masses, retractions, discharge or axillary adenopathy.  Inguinal/mons: Normal without inguinal adenopathy  External genitalia: Normal  BUS/Urethra/Skene's glands: Normal  Bladder: Normal  Vagina: Normal  Cervix: Normal  Uterus: normal in size, shape and contour. Midline and mobile  Adnexa/parametria:  Rt: Without masses or tenderness.  Lt: Without masses or tenderness.  Anus and perineum: Normal            Assessment & Plan:       Assessment & Plan:   Problem List Items Addressed This Visit       Unprioritized    Preventative health care    Pap performed today. Discussed healthy diet, exercise, weight loss. Labs as ordered.  Recommended flu shot and covid booster this fall. In terms of contraception, GYN told her OCP's would not be effective with provigil. She is considering Nexplanon or IUD. Will let me know if she needs referral.        Relevant Orders   Comp Met (CMET)   Lipid panel   TSH   Other Visit Diagnoses     Family history of diabetes mellitus    -  Primary  Relevant Orders   Comp Met (CMET)   Weight gain       Relevant Orders   TSH   Obesity (BMI 30.0-34.9)       Relevant Orders   Lipid panel   History of heavy periods       Relevant Orders   CBC with Differential/Platelet   Vaginal discharge       Relevant Orders   Cervicovaginal ancillary only( [place])   Encounter for routine gynecological examination with Papanicolaou smear of cervix       Relevant Orders   Cytology - PAP( [place])       I have discontinued Patria Mane D. Rask's fluconazole. I am also having her start on omeprazole. Additionally, I am having her maintain her modafinil and multivitamin with minerals.  Meds ordered this encounter  Medications  . omeprazole (PRILOSEC) 40 MG capsule    Sig: Take 1 capsule (40 mg total) by mouth daily.    Dispense:  30 capsule    Refill:  3    Order Specific Question:   Supervising Provider    Answer:   Penni Homans A [[id number]]

## 2022-04-05 LAB — CERVICOVAGINAL ANCILLARY ONLY
Bacterial Vaginitis (gardnerella): NEGATIVE
Candida Glabrata: NEGATIVE
Candida Vaginitis: NEGATIVE
Chlamydia: NEGATIVE
Comment: NEGATIVE
Comment: NEGATIVE
Comment: NEGATIVE
Comment: NEGATIVE
Comment: NORMAL
Neisseria Gonorrhea: NEGATIVE

## 2022-04-05 LAB — CYTOLOGY - PAP
Adequacy: ABSENT
Diagnosis: NEGATIVE

## 2022-04-06 ENCOUNTER — Other Ambulatory Visit: Payer: Self-pay | Admitting: Family

## 2022-04-06 DIAGNOSIS — E785 Hyperlipidemia, unspecified: Secondary | ICD-10-CM | POA: Insufficient documentation

## 2022-05-22 DIAGNOSIS — F4310 Post-traumatic stress disorder, unspecified: Secondary | ICD-10-CM | POA: Diagnosis not present

## 2022-05-29 DIAGNOSIS — F4310 Post-traumatic stress disorder, unspecified: Secondary | ICD-10-CM | POA: Diagnosis not present

## 2022-06-05 DIAGNOSIS — F4310 Post-traumatic stress disorder, unspecified: Secondary | ICD-10-CM | POA: Diagnosis not present

## 2022-06-13 DIAGNOSIS — F4310 Post-traumatic stress disorder, unspecified: Secondary | ICD-10-CM | POA: Diagnosis not present

## 2022-06-22 ENCOUNTER — Other Ambulatory Visit (HOSPITAL_BASED_OUTPATIENT_CLINIC_OR_DEPARTMENT_OTHER): Payer: Self-pay

## 2022-06-22 MED ORDER — MODAFINIL 200 MG PO TABS
ORAL_TABLET | ORAL | 1 refills | Status: DC
  Filled 2022-06-22: qty 60, 30d supply, fill #0

## 2022-06-28 DIAGNOSIS — F4310 Post-traumatic stress disorder, unspecified: Secondary | ICD-10-CM | POA: Diagnosis not present

## 2022-07-16 DIAGNOSIS — F4312 Post-traumatic stress disorder, chronic: Secondary | ICD-10-CM | POA: Diagnosis not present

## 2022-07-30 DIAGNOSIS — F4312 Post-traumatic stress disorder, chronic: Secondary | ICD-10-CM | POA: Diagnosis not present

## 2022-08-06 DIAGNOSIS — F4312 Post-traumatic stress disorder, chronic: Secondary | ICD-10-CM | POA: Diagnosis not present

## 2022-08-20 DIAGNOSIS — F4312 Post-traumatic stress disorder, chronic: Secondary | ICD-10-CM | POA: Diagnosis not present

## 2022-09-03 DIAGNOSIS — F4312 Post-traumatic stress disorder, chronic: Secondary | ICD-10-CM | POA: Diagnosis not present

## 2022-09-06 DIAGNOSIS — F4312 Post-traumatic stress disorder, chronic: Secondary | ICD-10-CM | POA: Diagnosis not present

## 2022-09-13 DIAGNOSIS — F4312 Post-traumatic stress disorder, chronic: Secondary | ICD-10-CM | POA: Diagnosis not present

## 2022-09-17 DIAGNOSIS — F4312 Post-traumatic stress disorder, chronic: Secondary | ICD-10-CM | POA: Diagnosis not present

## 2022-09-24 DIAGNOSIS — F4312 Post-traumatic stress disorder, chronic: Secondary | ICD-10-CM | POA: Diagnosis not present

## 2022-10-08 DIAGNOSIS — F4312 Post-traumatic stress disorder, chronic: Secondary | ICD-10-CM | POA: Diagnosis not present

## 2022-10-15 DIAGNOSIS — F4312 Post-traumatic stress disorder, chronic: Secondary | ICD-10-CM | POA: Diagnosis not present

## 2022-11-12 DIAGNOSIS — F4312 Post-traumatic stress disorder, chronic: Secondary | ICD-10-CM | POA: Diagnosis not present

## 2022-11-26 DIAGNOSIS — F4312 Post-traumatic stress disorder, chronic: Secondary | ICD-10-CM | POA: Diagnosis not present

## 2022-12-05 ENCOUNTER — Other Ambulatory Visit (HOSPITAL_BASED_OUTPATIENT_CLINIC_OR_DEPARTMENT_OTHER): Payer: Self-pay

## 2022-12-05 MED ORDER — MODAFINIL 200 MG PO TABS
ORAL_TABLET | ORAL | 1 refills | Status: DC
  Filled 2022-12-05: qty 60, 30d supply, fill #0
  Filled 2023-05-11: qty 60, 30d supply, fill #1

## 2022-12-06 ENCOUNTER — Other Ambulatory Visit (HOSPITAL_BASED_OUTPATIENT_CLINIC_OR_DEPARTMENT_OTHER): Payer: Self-pay

## 2022-12-10 DIAGNOSIS — F4312 Post-traumatic stress disorder, chronic: Secondary | ICD-10-CM | POA: Diagnosis not present

## 2022-12-17 DIAGNOSIS — F4312 Post-traumatic stress disorder, chronic: Secondary | ICD-10-CM | POA: Diagnosis not present

## 2022-12-25 DIAGNOSIS — F4312 Post-traumatic stress disorder, chronic: Secondary | ICD-10-CM | POA: Diagnosis not present

## 2022-12-28 DIAGNOSIS — F4312 Post-traumatic stress disorder, chronic: Secondary | ICD-10-CM | POA: Diagnosis not present

## 2022-12-31 DIAGNOSIS — F39 Unspecified mood [affective] disorder: Secondary | ICD-10-CM | POA: Diagnosis not present

## 2023-01-02 DIAGNOSIS — F39 Unspecified mood [affective] disorder: Secondary | ICD-10-CM | POA: Diagnosis not present

## 2023-01-07 DIAGNOSIS — F39 Unspecified mood [affective] disorder: Secondary | ICD-10-CM | POA: Diagnosis not present

## 2023-01-14 DIAGNOSIS — F39 Unspecified mood [affective] disorder: Secondary | ICD-10-CM | POA: Diagnosis not present

## 2023-01-24 DIAGNOSIS — F909 Attention-deficit hyperactivity disorder, unspecified type: Secondary | ICD-10-CM | POA: Diagnosis not present

## 2023-01-24 DIAGNOSIS — F332 Major depressive disorder, recurrent severe without psychotic features: Secondary | ICD-10-CM | POA: Diagnosis not present

## 2023-01-24 DIAGNOSIS — F4312 Post-traumatic stress disorder, chronic: Secondary | ICD-10-CM | POA: Diagnosis not present

## 2023-02-07 DIAGNOSIS — F4312 Post-traumatic stress disorder, chronic: Secondary | ICD-10-CM | POA: Diagnosis not present

## 2023-02-07 DIAGNOSIS — F332 Major depressive disorder, recurrent severe without psychotic features: Secondary | ICD-10-CM | POA: Diagnosis not present

## 2023-02-07 DIAGNOSIS — F909 Attention-deficit hyperactivity disorder, unspecified type: Secondary | ICD-10-CM | POA: Diagnosis not present

## 2023-02-12 DIAGNOSIS — F909 Attention-deficit hyperactivity disorder, unspecified type: Secondary | ICD-10-CM | POA: Diagnosis not present

## 2023-02-12 DIAGNOSIS — F4312 Post-traumatic stress disorder, chronic: Secondary | ICD-10-CM | POA: Diagnosis not present

## 2023-02-12 DIAGNOSIS — F332 Major depressive disorder, recurrent severe without psychotic features: Secondary | ICD-10-CM | POA: Diagnosis not present

## 2023-02-21 DIAGNOSIS — F4312 Post-traumatic stress disorder, chronic: Secondary | ICD-10-CM | POA: Diagnosis not present

## 2023-02-21 DIAGNOSIS — F332 Major depressive disorder, recurrent severe without psychotic features: Secondary | ICD-10-CM | POA: Diagnosis not present

## 2023-02-21 DIAGNOSIS — F909 Attention-deficit hyperactivity disorder, unspecified type: Secondary | ICD-10-CM | POA: Diagnosis not present

## 2023-02-28 DIAGNOSIS — F909 Attention-deficit hyperactivity disorder, unspecified type: Secondary | ICD-10-CM | POA: Diagnosis not present

## 2023-02-28 DIAGNOSIS — F332 Major depressive disorder, recurrent severe without psychotic features: Secondary | ICD-10-CM | POA: Diagnosis not present

## 2023-02-28 DIAGNOSIS — F4312 Post-traumatic stress disorder, chronic: Secondary | ICD-10-CM | POA: Diagnosis not present

## 2023-03-07 DIAGNOSIS — F909 Attention-deficit hyperactivity disorder, unspecified type: Secondary | ICD-10-CM | POA: Diagnosis not present

## 2023-03-07 DIAGNOSIS — F332 Major depressive disorder, recurrent severe without psychotic features: Secondary | ICD-10-CM | POA: Diagnosis not present

## 2023-03-07 DIAGNOSIS — F4312 Post-traumatic stress disorder, chronic: Secondary | ICD-10-CM | POA: Diagnosis not present

## 2023-03-14 DIAGNOSIS — F332 Major depressive disorder, recurrent severe without psychotic features: Secondary | ICD-10-CM | POA: Diagnosis not present

## 2023-03-14 DIAGNOSIS — F909 Attention-deficit hyperactivity disorder, unspecified type: Secondary | ICD-10-CM | POA: Diagnosis not present

## 2023-03-14 DIAGNOSIS — F4312 Post-traumatic stress disorder, chronic: Secondary | ICD-10-CM | POA: Diagnosis not present

## 2023-03-28 DIAGNOSIS — F3281 Premenstrual dysphoric disorder: Secondary | ICD-10-CM | POA: Diagnosis not present

## 2023-03-28 DIAGNOSIS — F902 Attention-deficit hyperactivity disorder, combined type: Secondary | ICD-10-CM | POA: Diagnosis not present

## 2023-03-28 DIAGNOSIS — F4312 Post-traumatic stress disorder, chronic: Secondary | ICD-10-CM | POA: Diagnosis not present

## 2023-04-04 DIAGNOSIS — F3281 Premenstrual dysphoric disorder: Secondary | ICD-10-CM | POA: Diagnosis not present

## 2023-04-04 DIAGNOSIS — F902 Attention-deficit hyperactivity disorder, combined type: Secondary | ICD-10-CM | POA: Diagnosis not present

## 2023-04-04 DIAGNOSIS — F4312 Post-traumatic stress disorder, chronic: Secondary | ICD-10-CM | POA: Diagnosis not present

## 2023-04-10 ENCOUNTER — Ambulatory Visit: Payer: Commercial Managed Care - PPO | Admitting: Family

## 2023-04-10 ENCOUNTER — Other Ambulatory Visit (HOSPITAL_BASED_OUTPATIENT_CLINIC_OR_DEPARTMENT_OTHER): Payer: Self-pay

## 2023-04-10 VITALS — BP 117/76 | HR 95 | Temp 98.2°F | Resp 16 | Wt 210.0 lb

## 2023-04-10 DIAGNOSIS — G4733 Obstructive sleep apnea (adult) (pediatric): Secondary | ICD-10-CM | POA: Diagnosis not present

## 2023-04-10 DIAGNOSIS — R1013 Epigastric pain: Secondary | ICD-10-CM | POA: Diagnosis not present

## 2023-04-10 DIAGNOSIS — R110 Nausea: Secondary | ICD-10-CM | POA: Diagnosis not present

## 2023-04-10 LAB — COMPREHENSIVE METABOLIC PANEL
ALT: 16 U/L (ref 0–35)
AST: 21 U/L (ref 0–37)
Albumin: 4.4 g/dL (ref 3.5–5.2)
Alkaline Phosphatase: 100 U/L (ref 39–117)
BUN: 7 mg/dL (ref 6–23)
CO2: 24 mEq/L (ref 19–32)
Calcium: 9.6 mg/dL (ref 8.4–10.5)
Chloride: 103 mEq/L (ref 96–112)
Creatinine, Ser: 0.74 mg/dL (ref 0.40–1.20)
GFR: 113.17 mL/min (ref >60.00)
Glucose, Bld: 95 mg/dL (ref 70–99)
Potassium: 4 mEq/L (ref 3.5–5.1)
Sodium: 139 mEq/L (ref 135–145)
Total Bilirubin: 0.4 mg/dL (ref 0.2–1.2)
Total Protein: 7.6 g/dL (ref 6.0–8.3)

## 2023-04-10 LAB — LIPASE: Lipase: 15 U/L (ref 11.0–59.0)

## 2023-04-10 MED ORDER — ONDANSETRON HCL 4 MG PO TABS
4.0000 mg | ORAL_TABLET | Freq: Three times a day (TID) | ORAL | 1 refills | Status: DC | PRN
  Filled 2023-04-10: qty 30, 10d supply, fill #0

## 2023-04-10 MED ORDER — OMEPRAZOLE 40 MG PO CPDR
40.0000 mg | DELAYED_RELEASE_CAPSULE | Freq: Every day | ORAL | 3 refills | Status: DC
Start: 2023-04-10 — End: 2023-05-28
  Filled 2023-04-10: qty 30, 30d supply, fill #0

## 2023-04-10 NOTE — Progress Notes (Signed)
Subjective:     [name] ID: Dorothy [name], female    DOB: 21-Jan-1996, 27 y.o.   MRN: [id number]  Chief Complaint  [name] presents with   Abdominal Pain    [name] reports having abdominal pain, "mostly after eating"    Abdominal Pain    Discussed Dorothy use of AI scribe software for clinical note transcription with Dorothy [name], who gave verbal consent to proceed.  History of Present Illness   Dorothy [name], with a history of gastritis and narcolepsy, presents with ongoing abdominal pain and nausea. Dorothy [name] reports a lack of appetite, often going all day without eating, and cramping, primarily in Dorothy upper abdomen. Dorothy symptoms are worse after eating and are associated with a sensation of fullness, even when Dorothy [name] has not eaten. Dorothy [name] also reports mood changes and cramping similar to her menstrual cycle, even outside of her menstrual period. Dorothy [name]'s mother, a nurse, suspects a hormonal or thyroid issue.  Dorothy [name] underwent an endoscopy three years ago, which revealed mild chronic gastritis but was negative for H. pylori. Dorothy [name] also had an ultrasound for a growth in her right axilla in 2021, which was determined to be benign fatty tissue.  Dorothy [name] also has possible narcolepsy and documented OSA which is managed with modafinil and CPAP respectively. Dorothy [name] is requesting a referral to a different sleep specialist due to dissatisfaction with her current provider.          Health Maintenance Due  Topic Date Due   COVID-19 Vaccine (3 - 2023-24 season) 07/06/2022    Past Medical History:  Diagnosis Date   Anemia    Narcolepsy    Sleep apnea    Uses a CPAP     No past surgical history on file.  Family History  Problem Relation Age of Onset   Learning disabilities Mother    Diabetes Father    Rheum arthritis Sister    Diabetes Maternal Grandmother    Stroke Maternal Grandmother    Heart disease Maternal Grandfather    Diabetes Paternal Grandmother    Cancer Paternal  Grandfather        unknown   Colon cancer Neg Hx    Esophageal cancer Neg Hx    Rectal cancer Neg Hx    Stomach cancer Neg Hx     Social History   Socioeconomic History   Marital status: Single    Spouse name: Not on file   Number of children: Not on file   Years of education: Not on file   Highest education level: Bachelor's degree (e.g., BA, AB, BS)  Occupational History   Not on file  Tobacco Use   Smoking status: Never   Smokeless tobacco: Never  Vaping Use   Vaping Use: Never used  Substance and Sexual Activity   Alcohol use: Yes    Comment: occasional 1-2   Drug use: No   Sexual activity: Not Currently    Birth control/protection: None  Other Topics Concern   Not on file  Social History Narrative   Lives with mom and her younger sister   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, psychology   Enjoys reading, music, color, spend time with family     Social Determinants of Health   Financial Resource Strain: Low Risk  (04/10/2023)   Overall Financial Resource Strain (CARDIA)    Difficulty of Paying Living Expenses: Not hard at all  Food Insecurity: No Food Insecurity (04/10/2023)   Hunger Vital Sign  Worried About Programme researcher, broadcasting/film/video in [place] Last Year: Never true    Ran Out of Food in Dorothy Last Year: Never true  Transportation Needs: No Transportation Needs (04/10/2023)   PRAPARE - Administrator, Civil Service (Medical): No    Lack of Transportation (Non-Medical): No  Physical Activity: Insufficiently Active (04/10/2023)   Exercise Vital Sign    Days of Exercise per Week: 3 days    Minutes of Exercise per Session: 30 min  Stress: No Stress Concern Present (04/10/2023)   [name] of Occupational Health - Occupational Stress Questionnaire    Feeling of Stress : Only a little  Social Connections: Socially Isolated (04/10/2023)   Social Connection and Isolation Panel [NHANES]    Frequency of Communication with Friends and Family: Once a week     Frequency of Social Gatherings with Friends and Family: Once a week    Attends Religious Services: 1 to 4 times per year    Active Member of [employer] or Organizations: No    Attends Engineer, structural: Not on file    Marital Status: Never married  Catering manager Violence: Not on file    Outpatient Medications Prior to Visit  Medication Sig Dispense Refill   modafinil (PROVIGIL) 200 MG tablet TAKE 1 TABLET BY MOUTH EVERY MORNING AND 1/2 TO 1 TABLET AT NOON 60 tablet 1   Multiple Vitamins-Minerals (MULTIVITAMIN WITH MINERALS) tablet Take 1 tablet by mouth daily.     omeprazole (PRILOSEC) 40 MG capsule Take 1 capsule (40 mg total) by mouth daily. 30 capsule 3   modafinil (PROVIGIL) 200 MG tablet 1 tab by mouth in Dorothy AM and 1/2 to 1 tab by mouth at noon     modafinil (PROVIGIL) 200 MG tablet TAKE 1 TABLET BY MOUTH EVERY MORNING AND 1/2 TO 1 TABLET AT NOON 60 tablet 1   No facility-administered medications prior to visit.    No Known Allergies  Review of Systems  Gastrointestinal:  Positive for abdominal pain.       Objective:    Physical Exam Constitutional:      General: Dorothy [name] is not in acute distress.    Appearance: Normal appearance. Dorothy [name] is well-developed.  HENT:     Head: Normocephalic and atraumatic.     Right Ear: External ear normal.     Left Ear: External ear normal.  Eyes:     General: No scleral icterus. Neck:     Thyroid: No thyromegaly.  Cardiovascular:     Rate and Rhythm: Normal rate and regular rhythm.     Heart sounds: Normal heart sounds. No murmur heard. Pulmonary:     Effort: Pulmonary effort is normal. No respiratory distress.     Breath sounds: Normal breath sounds. No wheezing.  Abdominal:     General: Bowel sounds are normal.     Palpations: Abdomen is soft.     Tenderness: There is abdominal tenderness in Dorothy right upper quadrant, epigastric area and left upper quadrant. There is no rebound.  Musculoskeletal:     Cervical back: Neck  supple.  Skin:    General: Skin is warm and dry.  Neurological:     Mental Status: Dorothy [name] is alert and oriented to person, place, and time.  Psychiatric:        Mood and Affect: Mood normal.        Behavior: Behavior normal.        Thought Content: Thought content normal.  Judgment: Judgment normal.      BP 117/76 (BP Location: Right Arm, [name] Position: Sitting, Cuff Size: Small)   Pulse 95   Temp 98.2 F (36.8 C) (Oral)   Resp 16   Wt 210 lb (95.3 kg)   SpO2 99%   BMI 34.95 kg/m  Wt Readings from Last 3 Encounters:  04/10/23 210 lb (95.3 kg)  04/04/22 198 lb (89.8 kg)  04/14/21 190 lb (86.2 kg)       Assessment & Plan:   Problem List Items Addressed This Visit       Unprioritized   OSA (obstructive sleep apnea) - Primary    Continues cpap, would like more formal narcolepsy evaluation.  Refer to sleep medicine.      Relevant Orders   Ambulatory referral to Pulmonology   Nausea    Zofran prn.  Work up as noted.      Relevant Orders   hCG, serum, qualitative   Epigastric pain    Abdominal Pain and Nausea: Chronic, postprandial, upper abdominal pain and nausea. No improvement with stress reduction. Prior endoscopy showed mild chronic gastritis and negative H. pylori. Differential includes recurrent gastritis, peptic ulcer disease, pancreatitis, constipation and cholecystitis. -Order abdominal ultrasound to evaluate for gallstones or other structural abnormalities. -Repeat H. pylori testing. -Start Omeprazole. -Prescribe Zofran for nausea as needed. -Check liver function tests and lipase, HCG -Follow up in 3 weeks to reassess symptoms and review test results.      Relevant Medications   ondansetron (ZOFRAN) 4 MG tablet   omeprazole (PRILOSEC) 40 MG capsule   Other Relevant Orders   US Abdomen Limited RUQ (LIVER/GB)   H. pylori breath test   Comp Met (CMET)   Lipase    I am having Aretta D. Lyn start on ondansetron. I am also having her  maintain her multivitamin with minerals, modafinil, and omeprazole.  Meds ordered this encounter  Medications   ondansetron (ZOFRAN) 4 MG tablet    Sig: Take 1 tablet (4 mg total) by mouth every 8 (eight) hours as needed for nausea or vomiting.    Dispense:  30 tablet    Refill:  1    Order Specific Question:   Supervising Provider    Answer:   Danise Edge A [[id number]]   omeprazole (PRILOSEC) 40 MG capsule    Sig: Take 1 capsule (40 mg total) by mouth daily.    Dispense:  30 capsule    Refill:  3    Order Specific Question:   Supervising Provider    Answer:   Danise Edge A [[id number]]

## 2023-04-10 NOTE — Assessment & Plan Note (Signed)
Abdominal Pain and Nausea: Chronic, postprandial, upper abdominal pain and nausea. No improvement with stress reduction. Prior endoscopy showed mild chronic gastritis and negative H. pylori. Differential includes recurrent gastritis, peptic ulcer disease, pancreatitis, constipation and cholecystitis. -Order abdominal ultrasound to evaluate for gallstones or other structural abnormalities. -Repeat H. pylori testing. -Start Omeprazole. -Prescribe Zofran for nausea as needed. -Check liver function tests and lipase, HCG -Follow up in 3 weeks to reassess symptoms and review test results.

## 2023-04-10 NOTE — Assessment & Plan Note (Signed)
Continues cpap, would like more formal narcolepsy evaluation.  Refer to sleep medicine.

## 2023-04-10 NOTE — Assessment & Plan Note (Signed)
Zofran prn.  Work up as noted.

## 2023-04-10 NOTE — Patient Instructions (Addendum)
  During your visit, we discussed your ongoing abdominal pain and nausea, as well as your narcolepsy and sleep apnea. We considered several possible causes for your stomach issues, including recurrent gastritis, peptic ulcer disease, pancreatitis, and cholecystitis. We also discussed your sleep disorders and the possibility of a consultation with a sleep medicine specialist.  YOUR PLAN:  -ABDOMINAL PAIN AND NAUSEA: Your stomach pain and nausea could be due to several conditions, such as inflammation of the stomach lining (gastritis), a peptic ulcer, pancreatitis, or gallbladder inflammation (cholecystitis). We will conduct some tests to find out the exact cause.  -NARCOLEPSY AND SLEEP APNEA: Your daytime sleepiness has improved with the use of a CPAP machine and Modafinil. However, we will refer you to a sleep medicine specialist to confirm the diagnosis of narcolepsy and possibly conduct a repeat sleep study.  INSTRUCTIONS:  We have ordered an abdominal ultrasound and a repeat H. pylori test to help diagnose your stomach issues. We have also prescribed Omeprazole to reduce stomach acid and Zofran to help with your nausea. We will check your liver function and lipase levels as well. Please follow up in 3 weeks to review your symptoms and test results. In the meantime, we have referred you to [facility] for a sleep medicine consultation.

## 2023-04-12 DIAGNOSIS — F4312 Post-traumatic stress disorder, chronic: Secondary | ICD-10-CM | POA: Diagnosis not present

## 2023-04-12 DIAGNOSIS — F902 Attention-deficit hyperactivity disorder, combined type: Secondary | ICD-10-CM | POA: Diagnosis not present

## 2023-04-12 DIAGNOSIS — F3281 Premenstrual dysphoric disorder: Secondary | ICD-10-CM | POA: Diagnosis not present

## 2023-04-15 ENCOUNTER — Encounter: Payer: Self-pay | Admitting: Family

## 2023-04-15 DIAGNOSIS — F3281 Premenstrual dysphoric disorder: Secondary | ICD-10-CM | POA: Diagnosis not present

## 2023-04-15 DIAGNOSIS — F4312 Post-traumatic stress disorder, chronic: Secondary | ICD-10-CM | POA: Diagnosis not present

## 2023-04-15 DIAGNOSIS — F902 Attention-deficit hyperactivity disorder, combined type: Secondary | ICD-10-CM | POA: Diagnosis not present

## 2023-04-15 LAB — H. PYLORI BREATH TEST: H. pylori Breath Test: DETECTED — AB

## 2023-04-16 ENCOUNTER — Other Ambulatory Visit (HOSPITAL_BASED_OUTPATIENT_CLINIC_OR_DEPARTMENT_OTHER): Payer: Self-pay

## 2023-04-16 ENCOUNTER — Other Ambulatory Visit: Payer: Self-pay | Admitting: Family

## 2023-04-16 MED ORDER — AMOXICILLIN 500 MG PO CAPS
1000.0000 mg | ORAL_CAPSULE | Freq: Two times a day (BID) | ORAL | 0 refills | Status: AC
  Filled 2023-04-16: qty 56, 14d supply, fill #0

## 2023-04-16 MED ORDER — CLARITHROMYCIN 500 MG PO TABS
500.0000 mg | ORAL_TABLET | Freq: Two times a day (BID) | ORAL | 0 refills | Status: DC
  Filled 2023-04-16: qty 28, 14d supply, fill #0

## 2023-04-26 ENCOUNTER — Other Ambulatory Visit (HOSPITAL_BASED_OUTPATIENT_CLINIC_OR_DEPARTMENT_OTHER): Payer: Self-pay

## 2023-04-29 ENCOUNTER — Ambulatory Visit (HOSPITAL_BASED_OUTPATIENT_CLINIC_OR_DEPARTMENT_OTHER): Payer: Commercial Managed Care - PPO

## 2023-05-10 DIAGNOSIS — F3281 Premenstrual dysphoric disorder: Secondary | ICD-10-CM | POA: Diagnosis not present

## 2023-05-10 DIAGNOSIS — F902 Attention-deficit hyperactivity disorder, combined type: Secondary | ICD-10-CM | POA: Diagnosis not present

## 2023-05-10 DIAGNOSIS — F4312 Post-traumatic stress disorder, chronic: Secondary | ICD-10-CM | POA: Diagnosis not present

## 2023-05-13 ENCOUNTER — Other Ambulatory Visit: Payer: Self-pay

## 2023-05-14 DIAGNOSIS — F3281 Premenstrual dysphoric disorder: Secondary | ICD-10-CM | POA: Diagnosis not present

## 2023-05-14 DIAGNOSIS — F902 Attention-deficit hyperactivity disorder, combined type: Secondary | ICD-10-CM | POA: Diagnosis not present

## 2023-05-14 DIAGNOSIS — F4312 Post-traumatic stress disorder, chronic: Secondary | ICD-10-CM | POA: Diagnosis not present

## 2023-05-21 DIAGNOSIS — F3281 Premenstrual dysphoric disorder: Secondary | ICD-10-CM | POA: Diagnosis not present

## 2023-05-21 DIAGNOSIS — F902 Attention-deficit hyperactivity disorder, combined type: Secondary | ICD-10-CM | POA: Diagnosis not present

## 2023-05-21 DIAGNOSIS — F4312 Post-traumatic stress disorder, chronic: Secondary | ICD-10-CM | POA: Diagnosis not present

## 2023-05-28 ENCOUNTER — Ambulatory Visit (INDEPENDENT_AMBULATORY_CARE_PROVIDER_SITE_OTHER): Payer: Commercial Managed Care - PPO | Admitting: Pulmonary Disease

## 2023-05-28 ENCOUNTER — Encounter (HOSPITAL_BASED_OUTPATIENT_CLINIC_OR_DEPARTMENT_OTHER): Payer: Self-pay | Admitting: Pulmonary Disease

## 2023-05-28 VITALS — BP 108/66 | HR 70 | Resp 14 | Ht 65.0 in | Wt 210.0 lb

## 2023-05-28 DIAGNOSIS — G47411 Narcolepsy with cataplexy: Secondary | ICD-10-CM | POA: Diagnosis not present

## 2023-05-28 DIAGNOSIS — G4733 Obstructive sleep apnea (adult) (pediatric): Secondary | ICD-10-CM

## 2023-05-28 DIAGNOSIS — F902 Attention-deficit hyperactivity disorder, combined type: Secondary | ICD-10-CM | POA: Diagnosis not present

## 2023-05-28 DIAGNOSIS — F4312 Post-traumatic stress disorder, chronic: Secondary | ICD-10-CM | POA: Diagnosis not present

## 2023-05-28 DIAGNOSIS — F3281 Premenstrual dysphoric disorder: Secondary | ICD-10-CM | POA: Diagnosis not present

## 2023-05-28 NOTE — Patient Instructions (Signed)
Call with the name of your CPAP equipment medical supply company, and then we can get a copy of your CPAP download and arrange for refitting of your CPAP mask.  You can go to the [employer] website to find more information about obstructive sleep apnea and narcolepsy.  Will arrange for a CPAP titration study followed by a multiple sleep latency test, and then schedule a follow up after these are reviewed.

## 2023-05-28 NOTE — Progress Notes (Signed)
[facility]  Chief Complaint  Patient presents with   New Patient (Initial Visit)    New patient pt had her sleep study done in 06/05/2, when using  c-pap she  she feels like a heaviness on her chest , she got a new c-pap  about 2 year ago. Think it could be the mask that she is using     Past Surgical History:  She  has no past surgical history on file.  Past Medical History:  Anemia, Depression  Constitutional:  BP 108/66   Pulse 70   Resp 14   Ht 5\' 5"  (1.651 m)   Wt 210 lb (95.3 kg)   SpO2 98%   BMI 34.95 kg/m   Brief Summary:  Dorothy [name] is a 27 y.o. 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      Subjective:   She was originally diagnosed with sleep apnea and narcolepsy in 2013.  She has been followed by sleep medicine with [facility] in [place].  She started having trouble in school.  Her teacher told her parents that she would fall asleep in class.  This lead to further assessment.  Initially she didn't realize she had sleep apnea, and just thought she had narcolepsy.  She had previous episodes of cataplexy when she gets anxious or stressed.  This also happened once during intercourse.  She has experienced episodes of sleep paralysis, but these don't seem to happen as frequently as before.  She used to have episodes of dreams coming to life when she was younger, but again not as frequent as before.  She has been using provigil and this helps some, but she still struggles to stay awake at times.  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  She has 10 hour shifts and this can be difficult to keep up with.  She usually can fall asleep easily, but she is trouble staying asleep, and frequently wakes up during the night.  She had a repeat sleep study in 2018 and this showed mild sleep apnea.  She was started on CPAP therapy.  She had a [employer] device.  This was recalled and she got  a replacement device from [employer].  She doesn't feel like the mask fits properly, and she gets heavy feeling in her chest from using CPAP.  She goes to sleep between 9 and 11 pm.  She falls asleep in 5 to 10 minutes.  She wakes up some times to use the bathroom.  She gets out of bed at 630 am.  She feels tired in the morning.  She denies morning headache.  She denies sleep walking, sleep talking, bruxism, or nightmares.  There is no history of restless legs.  The Epworth score is 17 out of 24.  She is followed by behavioral health for depression.  She has been using zoloft for the past month and this has helped some.   Physical Exam:   Appearance - well kempt   ENMT - no sinus tenderness, no oral exudate, no LAN, Mallampati 3 airway, no stridor, overbite  Respiratory - equal breath sounds bilaterally, no wheezing or rales  CV - s1s2 regular rate and rhythm, no murmurs  Ext - no clubbing, no edema  Skin - no rashes  Psych - normal mood and affect   Sleep Tests:  PSG 10/13/12 >> AHI 2.7 MSLT 10/14/12 >> mean sleep latency 1.6 min, 4 SOREM PSG  07/25/17 >> AHI 9.8, SpO2 low 91%  Social History:  She  reports that she has never smoked. She has never used smokeless tobacco. She reports current alcohol use. She reports that she does not use drugs.  Family History:  Her family history includes Cancer in her paternal grandfather; Diabetes in her father, maternal grandmother, and paternal grandmother; Heart disease in her maternal grandfather; Learning disabilities in her mother; Rheum arthritis in her sister; Stroke in her maternal grandmother.    Discussion:  She has symptoms of daytime sleepiness, disrupted sleep, cataplexy, sleep hallucinations, and sleep paralysis.  Previous sleep testing is consistent with narcolepsy.    She has history of obstructive sleep apnea.  She has trouble tolerating the CPAP mask and pressure setting.  Assessment/Plan:   Obstructive sleep  apnea. - reviewed her sleep study results - discussed how sleep apnea can impact her health - driving precautions were discussed - reviewed different treatment options - she will call with the name of her DME; will then see if she can get her mask refit and pressure adjusted - will arrange for a CPAP titration study to further assess the status of her sleep apnea therapy - she might be a good candidate for an oral appliance if she continues to have difficulties with CPAP therapy - discussed the issues she might having going ahead with a Respironics device and getting replacement supplies  Type 1 Narcolepsy. - reviewed her sleep study results - discussed the pathophysiology of narcolepsy - reviewed importance of regular sleep-wake schedule, proper sleep hygiene, and allowing for brief naps once or twice per day to alleviate sleep debt - will arrange for a multiple sleep latency test after her CPAP titration study - continue modafinil 200 mg in the morning and 100 mg in the afternoon; explained she will need to transition off stimulant medication and zoloft prior to doing the MSLT - she might need a sleep aide medication at night to help consolidate her sleep pattern - provided her with resources to learn more about living with narcolepsy - 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  You can go to the [employer] website to find more information about obstructive sleep apnea and narcolepsy.  Will arrange for a CPAP titration study followed by a multiple sleep latency test, and then schedule a follow up after these  are reviewed.  Medication List:   Allergies as of 05/28/2023   No Known Allergies      Medication List        Accurate as of May 28, 2023  2:18 PM. If you have any questions, ask your nurse or doctor.          STOP taking these medications    clarithromycin 500 MG tablet Commonly known as: BIAXIN Stopped by: Coralyn Helling   multivitamin with minerals tablet Stopped by: Coralyn Helling   omeprazole 40 MG capsule Commonly known as: PRILOSEC Stopped by: Raven Furnas   ondansetron 4 MG tablet Commonly known as: Zofran Stopped by: Coralyn Helling       TAKE these medications  modafinil 200 MG tablet Commonly known as: PROVIGIL TAKE 1 TABLET BY MOUTH EVERY MORNING AND 1/2 TO 1 TABLET AT NOON   sertraline 50 MG tablet Commonly known as: ZOLOFT Take 50 mg by mouth daily.        Signature:  Coralyn Helling, MD [facility] Pager - [phone] 05/28/2023, 2:18 PM

## 2023-06-04 DIAGNOSIS — F902 Attention-deficit hyperactivity disorder, combined type: Secondary | ICD-10-CM | POA: Diagnosis not present

## 2023-06-04 DIAGNOSIS — F3281 Premenstrual dysphoric disorder: Secondary | ICD-10-CM | POA: Diagnosis not present

## 2023-06-04 DIAGNOSIS — F4312 Post-traumatic stress disorder, chronic: Secondary | ICD-10-CM | POA: Diagnosis not present

## 2023-06-05 ENCOUNTER — Encounter (HOSPITAL_BASED_OUTPATIENT_CLINIC_OR_DEPARTMENT_OTHER): Payer: Self-pay | Admitting: Pulmonary Disease

## 2023-06-07 DIAGNOSIS — F3281 Premenstrual dysphoric disorder: Secondary | ICD-10-CM | POA: Diagnosis not present

## 2023-06-07 DIAGNOSIS — F902 Attention-deficit hyperactivity disorder, combined type: Secondary | ICD-10-CM | POA: Diagnosis not present

## 2023-06-07 DIAGNOSIS — F4312 Post-traumatic stress disorder, chronic: Secondary | ICD-10-CM | POA: Diagnosis not present

## 2023-06-13 ENCOUNTER — Other Ambulatory Visit (HOSPITAL_BASED_OUTPATIENT_CLINIC_OR_DEPARTMENT_OTHER): Payer: Self-pay

## 2023-06-13 MED ORDER — SERTRALINE HCL 50 MG PO TABS
50.0000 mg | ORAL_TABLET | Freq: Every day | ORAL | 0 refills | Status: DC
  Filled 2023-06-13: qty 60, 60d supply, fill #0

## 2023-06-18 DIAGNOSIS — F902 Attention-deficit hyperactivity disorder, combined type: Secondary | ICD-10-CM | POA: Diagnosis not present

## 2023-06-18 DIAGNOSIS — F3281 Premenstrual dysphoric disorder: Secondary | ICD-10-CM | POA: Diagnosis not present

## 2023-06-18 DIAGNOSIS — F4312 Post-traumatic stress disorder, chronic: Secondary | ICD-10-CM | POA: Diagnosis not present

## 2023-06-25 DIAGNOSIS — F902 Attention-deficit hyperactivity disorder, combined type: Secondary | ICD-10-CM | POA: Diagnosis not present

## 2023-06-25 DIAGNOSIS — F4312 Post-traumatic stress disorder, chronic: Secondary | ICD-10-CM | POA: Diagnosis not present

## 2023-06-25 DIAGNOSIS — F3281 Premenstrual dysphoric disorder: Secondary | ICD-10-CM | POA: Diagnosis not present

## 2023-07-01 ENCOUNTER — Encounter (HOSPITAL_BASED_OUTPATIENT_CLINIC_OR_DEPARTMENT_OTHER): Payer: Commercial Managed Care - PPO | Admitting: Pulmonary Disease

## 2023-07-02 ENCOUNTER — Encounter (HOSPITAL_BASED_OUTPATIENT_CLINIC_OR_DEPARTMENT_OTHER): Payer: Commercial Managed Care - PPO | Admitting: Pulmonary Disease

## 2023-07-16 DIAGNOSIS — F4312 Post-traumatic stress disorder, chronic: Secondary | ICD-10-CM | POA: Diagnosis not present

## 2023-07-16 DIAGNOSIS — F902 Attention-deficit hyperactivity disorder, combined type: Secondary | ICD-10-CM | POA: Diagnosis not present

## 2023-07-16 DIAGNOSIS — F3281 Premenstrual dysphoric disorder: Secondary | ICD-10-CM | POA: Diagnosis not present

## 2023-07-23 DIAGNOSIS — F4312 Post-traumatic stress disorder, chronic: Secondary | ICD-10-CM | POA: Diagnosis not present

## 2023-07-23 DIAGNOSIS — F902 Attention-deficit hyperactivity disorder, combined type: Secondary | ICD-10-CM | POA: Diagnosis not present

## 2023-07-23 DIAGNOSIS — F3281 Premenstrual dysphoric disorder: Secondary | ICD-10-CM | POA: Diagnosis not present

## 2023-07-30 ENCOUNTER — Encounter (HOSPITAL_BASED_OUTPATIENT_CLINIC_OR_DEPARTMENT_OTHER): Payer: Self-pay | Admitting: Pulmonary Disease

## 2023-07-30 ENCOUNTER — Encounter (HOSPITAL_BASED_OUTPATIENT_CLINIC_OR_DEPARTMENT_OTHER): Payer: Self-pay | Admitting: Internal Medicine

## 2023-07-30 DIAGNOSIS — F902 Attention-deficit hyperactivity disorder, combined type: Secondary | ICD-10-CM | POA: Diagnosis not present

## 2023-07-30 DIAGNOSIS — F4312 Post-traumatic stress disorder, chronic: Secondary | ICD-10-CM | POA: Diagnosis not present

## 2023-07-30 DIAGNOSIS — F3281 Premenstrual dysphoric disorder: Secondary | ICD-10-CM | POA: Diagnosis not present

## 2023-08-01 ENCOUNTER — Other Ambulatory Visit (HOSPITAL_BASED_OUTPATIENT_CLINIC_OR_DEPARTMENT_OTHER): Payer: Self-pay

## 2023-08-02 ENCOUNTER — Other Ambulatory Visit (HOSPITAL_BASED_OUTPATIENT_CLINIC_OR_DEPARTMENT_OTHER): Payer: Self-pay

## 2023-08-02 ENCOUNTER — Other Ambulatory Visit: Payer: Self-pay

## 2023-08-02 DIAGNOSIS — F3281 Premenstrual dysphoric disorder: Secondary | ICD-10-CM | POA: Diagnosis not present

## 2023-08-02 DIAGNOSIS — F4312 Post-traumatic stress disorder, chronic: Secondary | ICD-10-CM | POA: Diagnosis not present

## 2023-08-02 DIAGNOSIS — F902 Attention-deficit hyperactivity disorder, combined type: Secondary | ICD-10-CM | POA: Diagnosis not present

## 2023-08-02 MED ORDER — MODAFINIL 200 MG PO TABS
ORAL_TABLET | ORAL | 1 refills | Status: DC
  Filled 2023-08-02: qty 60, 30d supply, fill #0
  Filled 2023-11-19: qty 60, 30d supply, fill #1

## 2023-08-03 ENCOUNTER — Other Ambulatory Visit (HOSPITAL_BASED_OUTPATIENT_CLINIC_OR_DEPARTMENT_OTHER): Payer: Self-pay

## 2023-08-03 MED ORDER — SERTRALINE HCL 50 MG PO TABS
50.0000 mg | ORAL_TABLET | Freq: Every day | ORAL | 0 refills | Status: DC
  Filled 2023-08-03: qty 90, 90d supply, fill #0

## 2023-08-05 ENCOUNTER — Other Ambulatory Visit (HOSPITAL_BASED_OUTPATIENT_CLINIC_OR_DEPARTMENT_OTHER): Payer: Self-pay

## 2023-08-05 ENCOUNTER — Encounter (HOSPITAL_BASED_OUTPATIENT_CLINIC_OR_DEPARTMENT_OTHER): Payer: Commercial Managed Care - PPO | Admitting: Internal Medicine

## 2023-08-06 ENCOUNTER — Encounter (HOSPITAL_BASED_OUTPATIENT_CLINIC_OR_DEPARTMENT_OTHER): Payer: Commercial Managed Care - PPO | Admitting: Internal Medicine

## 2023-08-06 DIAGNOSIS — F3281 Premenstrual dysphoric disorder: Secondary | ICD-10-CM | POA: Diagnosis not present

## 2023-08-06 DIAGNOSIS — F902 Attention-deficit hyperactivity disorder, combined type: Secondary | ICD-10-CM | POA: Diagnosis not present

## 2023-08-06 DIAGNOSIS — F4312 Post-traumatic stress disorder, chronic: Secondary | ICD-10-CM | POA: Diagnosis not present

## 2023-08-07 ENCOUNTER — Emergency Department (HOSPITAL_BASED_OUTPATIENT_CLINIC_OR_DEPARTMENT_OTHER)
Admission: EM | Admit: 2023-08-07 | Discharge: 2023-08-07 | Disposition: A | Discharge status: Home / Self Care | Payer: Worker's Comp, Other unspecified | Attending: Emergency Medicine | Admitting: Emergency Medicine

## 2023-08-07 ENCOUNTER — Other Ambulatory Visit (HOSPITAL_BASED_OUTPATIENT_CLINIC_OR_DEPARTMENT_OTHER): Payer: Self-pay

## 2023-08-07 ENCOUNTER — Encounter (HOSPITAL_BASED_OUTPATIENT_CLINIC_OR_DEPARTMENT_OTHER): Payer: Self-pay

## 2023-08-07 DIAGNOSIS — W503XXA Accidental bite by another person, initial encounter: Secondary | ICD-10-CM | POA: Diagnosis not present

## 2023-08-07 DIAGNOSIS — Z23 Encounter for immunization: Secondary | ICD-10-CM | POA: Insufficient documentation

## 2023-08-07 DIAGNOSIS — S51851A Open bite of right forearm, initial encounter: Secondary | ICD-10-CM | POA: Diagnosis present

## 2023-08-07 MED ORDER — AMOXICILLIN-POT CLAVULANATE 875-125 MG PO TABS
1.0000 | ORAL_TABLET | Freq: Two times a day (BID) | ORAL | 0 refills | Status: AC
  Filled 2023-08-07: qty 10, 5d supply, fill #0

## 2023-08-07 MED ORDER — TETANUS-DIPHTH-ACELL PERTUSSIS 5-2.5-18.5 LF-MCG/0.5 IM SUSY
0.5000 mL | PREFILLED_SYRINGE | Freq: Once | INTRAMUSCULAR | Status: AC
  Administered 2023-08-07: 0.5 mL via INTRAMUSCULAR
  Filled 2023-08-07: qty 0.5
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
## 2023-08-20 ENCOUNTER — Ambulatory Visit: Payer: Commercial Managed Care - PPO | Admitting: Family

## 2023-08-20 ENCOUNTER — Ambulatory Visit: Payer: Self-pay | Admitting: Family

## 2023-08-20 VITALS — BP 131/85 | HR 80 | Temp 97.7°F | Resp 16 | Wt 209.0 lb

## 2023-08-20 DIAGNOSIS — S51851A Open bite of right forearm, initial encounter: Secondary | ICD-10-CM

## 2023-08-20 DIAGNOSIS — F3281 Premenstrual dysphoric disorder: Secondary | ICD-10-CM

## 2023-08-20 DIAGNOSIS — W503XXA Accidental bite by another person, initial encounter: Secondary | ICD-10-CM | POA: Insufficient documentation

## 2023-08-20 DIAGNOSIS — F902 Attention-deficit hyperactivity disorder, combined type: Secondary | ICD-10-CM | POA: Diagnosis not present

## 2023-08-20 DIAGNOSIS — Z23 Encounter for immunization: Secondary | ICD-10-CM | POA: Diagnosis not present

## 2023-08-20 DIAGNOSIS — G4733 Obstructive sleep apnea (adult) (pediatric): Secondary | ICD-10-CM

## 2023-08-20 DIAGNOSIS — F4312 Post-traumatic stress disorder, chronic: Secondary | ICD-10-CM | POA: Diagnosis not present
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
## 2023-08-20 NOTE — Assessment & Plan Note (Signed)
Neither bite broke the skin.  Should heal on their own.  Monitor.
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
## 2023-08-20 NOTE — Assessment & Plan Note (Addendum)
   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 Currently on Zoloft and Modafinil. Sleep study planned. -Continue current medications. -Undergo sleep study as planned. -Pt advised to keep her follow up visit with the sleep specialist.

## 2023-08-20 NOTE — Assessment & Plan Note (Signed)
Notes improvement with sertraline.

## 2023-08-27 DIAGNOSIS — F3281 Premenstrual dysphoric disorder: Secondary | ICD-10-CM | POA: Diagnosis not present

## 2023-08-27 DIAGNOSIS — F4312 Post-traumatic stress disorder, chronic: Secondary | ICD-10-CM | POA: Diagnosis not present

## 2023-08-27 DIAGNOSIS — F902 Attention-deficit hyperactivity disorder, combined type: Secondary | ICD-10-CM | POA: Diagnosis not present

## 2023-08-30 DIAGNOSIS — F3281 Premenstrual dysphoric disorder: Secondary | ICD-10-CM | POA: Diagnosis not present

## 2023-08-30 DIAGNOSIS — F902 Attention-deficit hyperactivity disorder, combined type: Secondary | ICD-10-CM | POA: Diagnosis not present

## 2023-08-30 DIAGNOSIS — F4312 Post-traumatic stress disorder, chronic: Secondary | ICD-10-CM | POA: Diagnosis not present

## 2023-09-02 ENCOUNTER — Other Ambulatory Visit (HOSPITAL_BASED_OUTPATIENT_CLINIC_OR_DEPARTMENT_OTHER): Payer: Self-pay

## 2023-09-02 MED ORDER — SERTRALINE HCL 50 MG PO TABS
50.0000 mg | ORAL_TABLET | Freq: Every day | ORAL | 0 refills | Status: DC
  Filled 2023-09-02 – 2023-09-13 (×2): qty 90, 90d supply, fill #0

## 2023-09-03 DIAGNOSIS — F902 Attention-deficit hyperactivity disorder, combined type: Secondary | ICD-10-CM | POA: Diagnosis not present

## 2023-09-03 DIAGNOSIS — F3281 Premenstrual dysphoric disorder: Secondary | ICD-10-CM | POA: Diagnosis not present

## 2023-09-03 DIAGNOSIS — F4312 Post-traumatic stress disorder, chronic: Secondary | ICD-10-CM | POA: Diagnosis not present

## 2023-09-10 ENCOUNTER — Ambulatory Visit (HOSPITAL_BASED_OUTPATIENT_CLINIC_OR_DEPARTMENT_OTHER): Payer: Commercial Managed Care - PPO | Attending: Pulmonary Disease | Admitting: Internal Medicine

## 2023-09-10 VITALS — Ht 65.0 in | Wt 210.0 lb

## 2023-09-10 DIAGNOSIS — G4733 Obstructive sleep apnea (adult) (pediatric): Secondary | ICD-10-CM | POA: Diagnosis not present

## 2023-09-10 DIAGNOSIS — G47411 Narcolepsy with cataplexy: Secondary | ICD-10-CM | POA: Diagnosis not present

## 2023-09-10 DIAGNOSIS — F3281 Premenstrual dysphoric disorder: Secondary | ICD-10-CM | POA: Diagnosis not present

## 2023-09-10 DIAGNOSIS — F4312 Post-traumatic stress disorder, chronic: Secondary | ICD-10-CM | POA: Diagnosis not present

## 2023-09-10 DIAGNOSIS — F902 Attention-deficit hyperactivity disorder, combined type: Secondary | ICD-10-CM | POA: Diagnosis not present

## 2023-09-11 ENCOUNTER — Encounter (HOSPITAL_BASED_OUTPATIENT_CLINIC_OR_DEPARTMENT_OTHER): Payer: Self-pay

## 2023-09-11 ENCOUNTER — Encounter (HOSPITAL_BASED_OUTPATIENT_CLINIC_OR_DEPARTMENT_OTHER): Payer: Commercial Managed Care - PPO | Admitting: Internal Medicine

## 2023-09-12 ENCOUNTER — Other Ambulatory Visit (HOSPITAL_BASED_OUTPATIENT_CLINIC_OR_DEPARTMENT_OTHER): Payer: Self-pay

## 2023-09-13 ENCOUNTER — Other Ambulatory Visit (HOSPITAL_BASED_OUTPATIENT_CLINIC_OR_DEPARTMENT_OTHER): Payer: Self-pay

## 2023-09-15 DIAGNOSIS — G4733 Obstructive sleep apnea (adult) (pediatric): Secondary | ICD-10-CM | POA: Diagnosis not present

## 2023-09-15 NOTE — Procedures (Signed)
Patient Name: Dorothy, [name] Study Date: 09/10/2023 Gender: Female D.O.B: January 07, 1996 Age (years): 26 Referring Provider: Coralyn Helling MD, ABSM Height (inches): 65 Interpreting Physician: Jetty Duhamel MD, ABSM Weight (lbs): 210 RPSGT: Ulyess Mort BMI: 35 MRN: [id number] Neck Size: 13.50  CLINICAL INFORMATION The patient is referred for a CPAP titration to treat sleep apnea.  Date of NPSG, Split Night or HST: NPSG 07/25/17 AHI 9.8/hr, desaturation to 91%.  SLEEP STUDY TECHNIQUE As per the AASM Manual for the Scoring of Sleep and Associated Events v2.3 (April 2016) with a hypopnea requiring 4% desaturations.  The channels recorded and monitored were frontal, central and occipital EEG, electrooculogram (EOG), submentalis EMG (chin), nasal and oral airflow, thoracic and abdominal wall motion, anterior tibialis EMG, snore microphone, electrocardiogram, and pulse oximetry. Continuous positive airway pressure (CPAP) was initiated at the beginning of the study and titrated to treat sleep-disordered breathing.  MEDICATIONS Medications self-administered by patient taken the night of the study : none reported  TECHNICIAN COMMENTS Comments added by technician: PATIENT WAS ORDERED AS A CPAP TITRATION. Comments added by scorer: N/A  RESPIRATORY PARAMETERS Optimal PAP Pressure (cm): 9 AHI at Optimal Pressure (/hr): 0 Overall Minimal O2 (%): 89.0 Supine % at Optimal Pressure (%): 33 Minimal O2 at Optimal Pressure (%): 94.0   SLEEP ARCHITECTURE The study was initiated at 11:01:24 PM and ended at 5:01:19 AM.  Sleep onset time was 17.5 minutes and the sleep efficiency was 67.9%. The total sleep time was 244.5 minutes.  The patient spent 18.8% of the night in stage N1 sleep, 42.3% in stage N2 sleep, 17.2% in stage N3 and 21.7% in REM.Stage REM latency was 67.0 minutes  Wake after sleep onset was 97.9. Alpha intrusion was absent. Supine sleep was 16.85%.  CARDIAC DATA The 2 lead EKG  demonstrated sinus rhythm. The mean heart rate was 69.6 beats per minute. Other EKG findings include: None.  LEG MOVEMENT DATA The total Periodic Limb Movements of Sleep (PLMS) were 0. The PLMS index was 0.0. A PLMS index of <15 is considered normal in adults.  IMPRESSIONS - The optimal PAP pressure was 9 cm of water. - Central sleep apnea was not noted during this titration (CAI = 2/h). - Mild oxygen desaturations were observed during this titration (min O2 = 89.0%). Minimum O2 saturation on CPAP 9 was 94%. - The patient snored with soft snoring volume during this titration study. - No cardiac abnormalities were observed during this study. - Clinically significant periodic limb movements were not noted during this study. Arousals associated with PLMs were rare. - Sleep architecture was fragmented with frequent nonspecific arousals and brief awakenings. Consider if management for insomnia would be appropriate.  DIAGNOSIS - Obstructive Sleep Apnea (G47.33)  RECOMMENDATIONS - Trial of CPAP therapy on 9 cm H2O or autopap 5-15. - Patient wore a Extra Small size [employer] Full Face Evora Full mask and heated humidification. - Narcolepsy is marked by unstable shifting between sleep and wake. There can be a role for management to help consolidate night time sleep. - Sleep hygiene should be reviewed to assess factors that may improve sleep quality. - Weight management and regular exercise should be initiated or continued.  [Electronically signed] 09/15/2023 11:16 AM  Jetty Duhamel MD, ABSM Diplomate, American Board of Sleep Medicine NPI: [id number]                         Jetty Duhamel Diplomate, American Board of Sleep Medicine  ELECTRONICALLY  SIGNED ON:  09/15/2023, 11:08 AM [facility] PH: [phone]   FX: [phone] ACCREDITED BY THE AMERICAN ACADEMY OF SLEEP MEDICINE

## 2023-09-17 DIAGNOSIS — F3281 Premenstrual dysphoric disorder: Secondary | ICD-10-CM | POA: Diagnosis not present

## 2023-09-17 DIAGNOSIS — F4312 Post-traumatic stress disorder, chronic: Secondary | ICD-10-CM | POA: Diagnosis not present

## 2023-09-17 DIAGNOSIS — F902 Attention-deficit hyperactivity disorder, combined type: Secondary | ICD-10-CM | POA: Diagnosis not present

## 2023-09-19 DIAGNOSIS — Z713 Dietary counseling and surveillance: Secondary | ICD-10-CM | POA: Diagnosis not present

## 2023-09-19 DIAGNOSIS — Z833 Family history of diabetes mellitus: Secondary | ICD-10-CM | POA: Diagnosis not present

## 2023-09-24 DIAGNOSIS — F902 Attention-deficit hyperactivity disorder, combined type: Secondary | ICD-10-CM | POA: Diagnosis not present

## 2023-09-24 DIAGNOSIS — F4312 Post-traumatic stress disorder, chronic: Secondary | ICD-10-CM | POA: Diagnosis not present

## 2023-09-24 DIAGNOSIS — F3281 Premenstrual dysphoric disorder: Secondary | ICD-10-CM | POA: Diagnosis not present

## 2023-10-01 ENCOUNTER — Emergency Department (HOSPITAL_BASED_OUTPATIENT_CLINIC_OR_DEPARTMENT_OTHER)
Admission: EM | Admit: 2023-10-01 | Discharge: 2023-10-01 | Disposition: A | Discharge status: Home / Self Care | Payer: Worker's Compensation | Attending: Emergency Medicine | Admitting: Emergency Medicine

## 2023-10-01 ENCOUNTER — Other Ambulatory Visit: Payer: Self-pay

## 2023-10-01 ENCOUNTER — Encounter (HOSPITAL_BASED_OUTPATIENT_CLINIC_OR_DEPARTMENT_OTHER): Payer: Self-pay | Admitting: Emergency Medicine

## 2023-10-01 ENCOUNTER — Ambulatory Visit: Payer: Self-pay

## 2023-10-01 DIAGNOSIS — W503XXA Accidental bite by another person, initial encounter: Secondary | ICD-10-CM | POA: Diagnosis not present

## 2023-10-01 DIAGNOSIS — Y990 Civilian activity done for income or pay: Secondary | ICD-10-CM | POA: Insufficient documentation

## 2023-10-01 DIAGNOSIS — S79922A Unspecified injury of left thigh, initial encounter: Secondary | ICD-10-CM | POA: Diagnosis present

## 2023-10-01 DIAGNOSIS — S71152A Open bite, left thigh, initial encounter: Secondary | ICD-10-CM | POA: Insufficient documentation

## 2023-10-01 DIAGNOSIS — S51851A Open bite of right forearm, initial encounter: Secondary | ICD-10-CM | POA: Diagnosis not present

## 2023-10-01 MED ORDER — AMOXICILLIN-POT CLAVULANATE 875-125 MG PO TABS
1.0000 | ORAL_TABLET | Freq: Once | ORAL | Status: AC
  Administered 2023-10-01: 1 via ORAL
  Filled 2023-10-01: qty 1

## 2023-10-01 MED ORDER — AMOXICILLIN-POT CLAVULANATE 875-125 MG PO TABS
1.0000 | ORAL_TABLET | Freq: Two times a day (BID) | ORAL | 0 refills | Status: DC
  Filled 2023-10-01: qty 14, 7d supply, fill #0
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
## 2023-10-02 ENCOUNTER — Other Ambulatory Visit (HOSPITAL_BASED_OUTPATIENT_CLINIC_OR_DEPARTMENT_OTHER): Payer: Self-pay

## 2023-10-08 DIAGNOSIS — F902 Attention-deficit hyperactivity disorder, combined type: Secondary | ICD-10-CM | POA: Diagnosis not present

## 2023-10-08 DIAGNOSIS — F3281 Premenstrual dysphoric disorder: Secondary | ICD-10-CM | POA: Diagnosis not present

## 2023-10-08 DIAGNOSIS — F4312 Post-traumatic stress disorder, chronic: Secondary | ICD-10-CM | POA: Diagnosis not present

## 2023-10-15 DIAGNOSIS — F4312 Post-traumatic stress disorder, chronic: Secondary | ICD-10-CM | POA: Diagnosis not present

## 2023-10-15 DIAGNOSIS — F3281 Premenstrual dysphoric disorder: Secondary | ICD-10-CM | POA: Diagnosis not present

## 2023-10-15 DIAGNOSIS — F902 Attention-deficit hyperactivity disorder, combined type: Secondary | ICD-10-CM | POA: Diagnosis not present

## 2023-10-22 DIAGNOSIS — F4312 Post-traumatic stress disorder, chronic: Secondary | ICD-10-CM | POA: Diagnosis not present

## 2023-10-22 DIAGNOSIS — F3281 Premenstrual dysphoric disorder: Secondary | ICD-10-CM | POA: Diagnosis not present

## 2023-10-22 DIAGNOSIS — F902 Attention-deficit hyperactivity disorder, combined type: Secondary | ICD-10-CM | POA: Diagnosis not present

## 2023-10-29 DIAGNOSIS — F3281 Premenstrual dysphoric disorder: Secondary | ICD-10-CM | POA: Diagnosis not present

## 2023-10-29 DIAGNOSIS — F902 Attention-deficit hyperactivity disorder, combined type: Secondary | ICD-10-CM | POA: Diagnosis not present

## 2023-10-29 DIAGNOSIS — F4312 Post-traumatic stress disorder, chronic: Secondary | ICD-10-CM | POA: Diagnosis not present

## 2023-11-11 ENCOUNTER — Ambulatory Visit: Payer: Commercial Managed Care - PPO | Admitting: Family

## 2023-11-19 DIAGNOSIS — F902 Attention-deficit hyperactivity disorder, combined type: Secondary | ICD-10-CM | POA: Diagnosis not present

## 2023-11-19 DIAGNOSIS — F3281 Premenstrual dysphoric disorder: Secondary | ICD-10-CM | POA: Diagnosis not present

## 2023-11-19 DIAGNOSIS — F4312 Post-traumatic stress disorder, chronic: Secondary | ICD-10-CM | POA: Diagnosis not present

## 2023-11-20 ENCOUNTER — Other Ambulatory Visit: Payer: Self-pay

## 2023-11-20 ENCOUNTER — Telehealth: Payer: Self-pay

## 2023-11-20 NOTE — Telephone Encounter (Signed)
 Called but no answer, left detailed message for patient to call back about her bill question

## 2023-11-20 NOTE — Telephone Encounter (Signed)
 Caller Name Cloyce Levendusky Caller Phone Number [phone] Patient Name Dorothy [name] Patient DOB [birth year] Call Type Message Only Information Provided Reason for Call Billing Question Initial Comment Pt states she was supposed to do a sleep study but stayed overnight for her CPAP and she was transferred to [facility] the Sleep Center. She was calling in regards to the bill. It was $500. Additional Comment Provided office hours. Disp. Time Disposition Final User 11/19/2023 7:05:18 PM General Information Provided Yes Monyjang, Rudi Call Closed By: Rudolpho Costa Transaction Date/Time: 11/19/2023 7:00:43 PM (ET)

## 2023-11-26 ENCOUNTER — Encounter: Payer: Commercial Managed Care - PPO | Admitting: Family

## 2023-11-26 DIAGNOSIS — F4312 Post-traumatic stress disorder, chronic: Secondary | ICD-10-CM | POA: Diagnosis not present

## 2023-11-26 DIAGNOSIS — F3281 Premenstrual dysphoric disorder: Secondary | ICD-10-CM | POA: Diagnosis not present

## 2023-11-26 DIAGNOSIS — F902 Attention-deficit hyperactivity disorder, combined type: Secondary | ICD-10-CM | POA: Diagnosis not present

## 2023-12-03 ENCOUNTER — Encounter: Payer: Commercial Managed Care - PPO | Admitting: Family

## 2023-12-03 DIAGNOSIS — F4312 Post-traumatic stress disorder, chronic: Secondary | ICD-10-CM | POA: Diagnosis not present

## 2023-12-03 DIAGNOSIS — F3281 Premenstrual dysphoric disorder: Secondary | ICD-10-CM | POA: Diagnosis not present

## 2023-12-03 DIAGNOSIS — F902 Attention-deficit hyperactivity disorder, combined type: Secondary | ICD-10-CM | POA: Diagnosis not present

## 2023-12-10 ENCOUNTER — Ambulatory Visit (INDEPENDENT_AMBULATORY_CARE_PROVIDER_SITE_OTHER): Payer: Commercial Managed Care - PPO | Admitting: Family

## 2023-12-10 ENCOUNTER — Other Ambulatory Visit (HOSPITAL_COMMUNITY)
Admission: RE | Admit: 2023-12-10 | Discharge: 2023-12-10 | Disposition: A | Discharge status: Home / Self Care | Payer: Commercial Managed Care - PPO | Source: Ambulatory Visit | Attending: Family | Admitting: Family

## 2023-12-10 ENCOUNTER — Encounter: Payer: Self-pay | Admitting: Family

## 2023-12-10 VITALS — BP 96/58 | HR 77 | Temp 99.0°F | Resp 16 | Ht 65.0 in | Wt 207.0 lb

## 2023-12-10 DIAGNOSIS — R059 Cough, unspecified: Secondary | ICD-10-CM | POA: Insufficient documentation

## 2023-12-10 DIAGNOSIS — M25572 Pain in left ankle and joints of left foot: Secondary | ICD-10-CM | POA: Diagnosis not present

## 2023-12-10 DIAGNOSIS — E785 Hyperlipidemia, unspecified: Secondary | ICD-10-CM | POA: Diagnosis not present

## 2023-12-10 DIAGNOSIS — R140 Abdominal distension (gaseous): Secondary | ICD-10-CM | POA: Insufficient documentation

## 2023-12-10 DIAGNOSIS — Z113 Encounter for screening for infections with a predominantly sexual mode of transmission: Secondary | ICD-10-CM

## 2023-12-10 DIAGNOSIS — T148XXA Other injury of unspecified body region, initial encounter: Secondary | ICD-10-CM

## 2023-12-10 DIAGNOSIS — Z30019 Encounter for initial prescription of contraceptives, unspecified: Secondary | ICD-10-CM

## 2023-12-10 DIAGNOSIS — Z0000 Encounter for general adult medical examination without abnormal findings: Secondary | ICD-10-CM

## 2023-12-10 DIAGNOSIS — F3281 Premenstrual dysphoric disorder: Secondary | ICD-10-CM | POA: Diagnosis not present

## 2023-12-10 DIAGNOSIS — F4312 Post-traumatic stress disorder, chronic: Secondary | ICD-10-CM | POA: Diagnosis not present

## 2023-12-10 DIAGNOSIS — F902 Attention-deficit hyperactivity disorder, combined type: Secondary | ICD-10-CM | POA: Diagnosis not present

## 2023-12-10 NOTE — Assessment & Plan Note (Signed)
 It sounds like she had an illness in December, then another illness in January. Some mucus in her chest per pt report. Lungs clear on exam. Should improve over the next few weeks.  Recommended trial of claritin to see if this helps with the mucous production.

## 2023-12-10 NOTE — Assessment & Plan Note (Signed)
Notes abdominal bloating/flatulence.  Worse after dairy. Recommended pt to eliminate dairy due to likely lactose intolerance and to try the low fodmap diet.

## 2023-12-10 NOTE — Patient Instructions (Signed)
 VISIT SUMMARY:  Today, you had your annual physical exam and we discussed various health concerns, including birth control options, a persistent cough, gastrointestinal discomfort, unexplained bruising, and a recent ankle injury. We also reviewed your general health maintenance and planned necessary screenings and tests.  YOUR PLAN:  -CONTRACEPTION: You have previously used oral contraceptive pills with poor response and side effects. We discussed the benefits of the Mirena IUD, which can provide lighter bleeding and long-term efficacy. You will be referred to Gynecology for discussion of IUD placement.  -SEXUALLY TRANSMITTED DISEASE SCREENING: Regular screening for sexually transmitted diseases is important for your health. We will conduct tests including a vaginal swab, syphilis screen, HIV, and Hepatitis tests.  -CHRONIC COUGH: You have had a persistent cough since December, mainly at night, with mucus production but no fever. We recommend trying Claritin once daily and have ensured there are no signs of pneumonia.  -GASTROINTESTINAL DISCOMFORT: You experience bloating and gas, particularly after consuming dairy, which may indicate lactose intolerance. We recommend eliminating dairy from your diet and will provide information on a low FODMAP diet to identify other potential triggers.  -HYPERLIPIDEMIA: You had slightly elevated cholesterol levels noted last year. We will order a lipid panel to check your current cholesterol levels.  -UNEXPLAINED BRUISING: You have reported large, unexplained bruises on your arms and legs. We will order a complete blood count to evaluate potential causes.  -ANKLE INJURY: You experienced a fall at work resulting in persistent pain in your left ankle. We will order an X-ray to assess the injury.  -GENERAL HEALTH MAINTENANCE: Continue with your regular exercise and healthy diet. We performed a physical examination including a Pap smear. Please schedule a follow-up  appointment in one year.  INSTRUCTIONS:  Please follow up with Gynecology for IUD placement. We will conduct the necessary tests for sexually transmitted diseases, a lipid panel, and a complete blood count. Additionally, an X-ray of your left ankle will be ordered. Schedule your next annual physical exam in one year.

## 2023-12-10 NOTE — Assessment & Plan Note (Signed)
Pap, immunizations reviewed and up to date.  Continue work on [employer], exercise and weight loss.

## 2023-12-10 NOTE — Progress Notes (Signed)
 Subjective:     Patient ID: Dorothy [name], female    DOB: Apr 03, 1996, 28 y.o.   MRN: [id number]  Chief Complaint  Patient presents with   STD screening    Will like to test for all STD's. No symptoms   Contraception    Will like to discuss birth control   Cough    Patient complains of productive cough since December.     Cough Associated symptoms include headaches (sometimes) and weight loss. Pertinent negatives include no myalgias or rash.    Discussed the use of AI scribe software for clinical note transcription with the patient, who gave verbal consent to proceed.  History of Present Illness   Dorothy [name] is a 28 year old female who presents for an annual physical exam and discussion of birth control options.  She has previously used oral contraceptive pills for menstrual cramps and irregular cycles but experienced poor response and side effects. She is considering an intrauterine device (IUD) due to potential interactions with her current medication, modafinil , which may reduce the effectiveness of other contraceptive methods.  She has had a persistent cough since December, following two episodes of illness around the holiday season. The cough is productive of mucus, particularly at night, but there is no fever. The cough is sometimes tickly and sometimes due to mucus accumulation. No sinus drainage or fever is reported.  She experiences gastrointestinal discomfort, feeling bloated and gassy, particularly after consuming dairy, indicating possible lactose intolerance. She has been trying to monitor her diet to identify other potential triggers. Occasional constipation is noted, attributed to insufficient water intake and irregular meals.  She mentions a recent fall at work about a month ago, resulting in pain in her left leg, particularly when rotating it. She has been managing it with ice and rest but notes persistent discomfort.  She occasionally experiences large,  unexplained bruises on her arms and legs, which take a long time to resolve. No known trauma causing these bruises is reported.  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 She is planning to attend [employer] [facility] [employer] for a speech pathology program. She lives with her mother and sister, occasionally consumes alcohol, about one to two drinks per week, and denies any drug use. Her sexual partners are typically female, and she currently does not have a regular partner. She does not use tobacco or vape.  Immunizations: flu shot, HPV and tetanus up to date Diet: reports a healthy diet Wt Readings from Last 3 Encounters:  12/10/23 207 lb (93.9 kg)  10/01/23 210 lb 2.6 oz (95.3 kg)  09/10/23 210 lb (95.3 kg)  Exercise: has been attending the gym 2 x a week, active with kids at work.  Pap Smear: 2023 Vision: due Dental: scheduled       Health Maintenance Due  Topic Date Due   COVID-19 Vaccine (3 - 2024-25 season) 07/07/2023    Past Medical History:  Diagnosis Date   Anemia    Narcolepsy    Sleep apnea    Uses a CPAP     History reviewed. No pertinent surgical history.  Family History  Problem Relation Age of Onset   Learning disabilities Mother    Diabetes Father    Rheum arthritis Sister    Diabetes type I Brother    Diabetes Maternal Grandmother    Stroke Maternal Grandmother    Hypertension  Maternal Grandmother    Heart disease Maternal Grandfather    Diabetes Paternal Grandmother        ? copd   Cancer Paternal Grandfather        unknown   Colon cancer Neg Hx    Esophageal cancer Neg Hx    Rectal cancer Neg Hx    Stomach cancer Neg Hx     Social History   Socioeconomic History   Marital status: Single    Spouse name: Not on file   Number of children: Not on file   Years of education: Not on file   Highest education level: Bachelor's  degree (e.g., BA, AB, BS)  Occupational History   Not on file  Tobacco Use   Smoking status: Never   Smokeless tobacco: Never  Vaping Use   Vaping status: Never Used  Substance and Sexual Activity   Alcohol use: Yes    Comment: occasional 1-2   Drug use: No   Sexual activity: Not Currently    Partners: Male    Birth control/protection: Condom  Other Topics Concern   Not on file  Social History Narrative   Lives with mom and her younger sister   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   Enjoys reading, music, color, spend time with family     Social Drivers of Health   Financial Resource Strain: Low Risk  (11/08/2023)   Overall Financial Resource Strain (CARDIA)    Difficulty of Paying Living Expenses: Not hard at all  Food Insecurity: No Food Insecurity (11/08/2023)   Hunger Vital Sign    Worried About Running Out of Food in the Last Year: Never true    Ran Out of Food in the Last Year: Never true  Transportation Needs: No Transportation Needs (11/08/2023)   PRAPARE - Administrator, Civil Service (Medical): No    Lack of Transportation (Non-Medical): No  Physical Activity: Insufficiently Active (11/08/2023)   Exercise Vital Sign    Days of Exercise per Week: 2 days    Minutes of Exercise per Session: 30 min  Stress: No Stress Concern Present (11/08/2023)   [name] of Occupational Health - Occupational Stress Questionnaire    Feeling of Stress : Only a little  Social Connections: Moderately Integrated (11/08/2023)   Social Connection and Isolation Panel [NHANES]    Frequency of Communication with Friends and Family: Three times a week    Frequency of Social Gatherings with Friends and Family: Twice a week    Attends Religious Services: 1 to 4 times per year    Active Member of [employer] or Organizations: Yes    Attends Banker Meetings: 1 to 4 times per year    Marital Status: Never married  Recent Concern: Social Connections - Moderately Isolated  (08/20/2023)   Social Connection and Isolation Panel [NHANES]    Frequency of Communication with Friends and Family: Twice a week    Frequency of Social Gatherings with Friends and Family: Once a week    Attends Religious Services: 1 to 4 times per year    Active Member of [employer] or Organizations: No    Attends Engineer, Structural: Not on file    Marital Status: Never married  Intimate Partner Violence: Not on file    Outpatient Medications Prior to Visit  Medication Sig Dispense Refill   modafinil  (PROVIGIL ) 200 MG tablet Take 1 tablet (200 mg total) by mouth every morning AND 0.5-1 tablets (100-200 mg total)  daily at 12 noon. 60 tablet 1   sertraline  (ZOLOFT ) 50 MG tablet Take 1 tablet by mouth daily 90 tablet 0   amoxicillin -clavulanate (AUGMENTIN ) 875-125 MG tablet Take 1 tablet by mouth every 12 (twelve) hours. 14 tablet 0   sertraline  (ZOLOFT ) 50 MG tablet Take 50 mg by mouth daily.     sertraline  (ZOLOFT ) 50 MG tablet Take 1 tablet (50 mg total) by mouth daily. 90 tablet 0   No facility-administered medications prior to visit.    Not on File  Review of Systems  Constitutional:  Positive for weight loss.  HENT:  Negative for hearing loss.   Eyes:  Negative for blurred vision.  Respiratory:  Positive for cough.   Cardiovascular:  Negative for leg swelling.  Gastrointestinal:  Positive for constipation (sometimes). Negative for diarrhea.  Genitourinary:  Negative for dysuria and frequency.  Musculoskeletal:  Negative for joint pain and myalgias.  Skin:  Negative for rash.  Neurological:  Positive for headaches (sometimes).  Endo/Heme/Allergies:  Bruises/bleeds easily.  Psychiatric/Behavioral:         Denies depression/anxiety       Objective:    Physical Exam   BP (!) 96/58 (BP Location: Right Arm, Patient Position: Sitting, Cuff Size: Normal)   Pulse 77   Temp 99 F (37.2 C) (Oral)   Resp 16   Ht 5' 5 (1.651 m)   Wt 207 lb (93.9 kg)   SpO2 99%    BMI 34.45 kg/m  Wt Readings from Last 3 Encounters:  12/10/23 207 lb (93.9 kg)  10/01/23 210 lb 2.6 oz (95.3 kg)  09/10/23 210 lb (95.3 kg)  Physical Exam  Constitutional: She is oriented to person, place, and time. She appears well-developed and well-nourished. No distress.  HENT:  Head: Normocephalic and atraumatic.  Right Ear: Tympanic membrane and ear canal normal.  Left Ear: Tympanic membrane and ear canal normal.  Mouth/Throat: Oropharynx is clear and moist.  Eyes: Pupils are equal, round, and reactive to light. No scleral icterus.  Neck: Normal range of motion. No thyromegaly present.  Cardiovascular: Normal rate and regular rhythm.   No murmur heard. Pulmonary/Chest: Effort normal and breath sounds normal. No respiratory distress. He has no wheezes. She has no rales. She exhibits no tenderness.  Abdominal: Soft. Bowel sounds are normal. She exhibits no distension and no mass. There is no tenderness. There is no rebound and no guarding.  Musculoskeletal: She exhibits no edema.  Lymphadenopathy:    She has no cervical adenopathy.  Neurological: She is alert and oriented to person, place, and time. She has normal patellar reflexes. She exhibits normal muscle tone. Coordination normal.  Skin: Skin is warm and dry.  Psychiatric: She has a normal mood and affect. Her behavior is normal. Judgment and thought content normal.  Breasts: Examined lying Right: Without masses, retractions, discharge or axillary adenopathy.  Left: Without masses, retractions, discharge or axillary adenopathy.  Inguinal/mons: Normal without inguinal adenopathy  External genitalia: Normal  BUS/Urethra/Skene's glands: Normal  Bladder: Normal  Vagina: Normal, odor consistent with BV noted Anus and perineum: Normal            Assessment & Plan:        Assessment & Plan:   Problem List Items Addressed This Visit       Unprioritized   Preventative health care - Primary   Pap, immunizations  reviewed and up to date.  Continue work on [employer], exercise and weight loss.  Left ankle pain   ? Sprain, due to duration of pain will obtain x-ray to rule out fracture.       Relevant Orders   DG Ankle Complete Left   Hyperlipidemia   Relevant Orders   Lipid panel   Cough   It sounds like she had an illness in December, then another illness in January. Some mucus in her chest per pt report. Lungs clear on exam. Should improve over the next few weeks.  Recommended trial of claritin to see if this helps with the mucous production.       Abdominal bloating   Notes abdominal bloating/flatulence.  Worse after dairy. Recommended pt to eliminate dairy due to likely lactose intolerance and to try the low fodmap diet.       Other Visit Diagnoses       Routine screening for STI (sexually transmitted infection)       Relevant Orders   HepB+HepC+HIV Panel   HSV 2 antibody, IgG   RPR   Cervicovaginal ancillary only( [place])     Encounter for initial prescription of contraceptives, unspecified contraceptive       Relevant Orders   Ambulatory referral to Obstetrics / Gynecology     Bruising       Relevant Orders   CBC w/Diff       I have discontinued Ellagrace D. Lekas's amoxicillin -clavulanate. I am also having her maintain her modafinil  and sertraline .  No orders of the defined types were placed in this encounter.

## 2023-12-10 NOTE — Assessment & Plan Note (Signed)
?   Sprain, due to duration of pain will obtain x-ray to rule out fracture.

## 2023-12-11 LAB — LIPID PANEL
Cholesterol: 211 mg/dL — ABNORMAL HIGH (ref 0–200)
HDL: 61.8 mg/dL (ref >39.00)
LDL Cholesterol: 134 mg/dL — ABNORMAL HIGH (ref 0–99)
NonHDL: 149.63
Total CHOL/HDL Ratio: 3
Triglycerides: 78 mg/dL (ref 0.0–149.0)
VLDL: 15.6 mg/dL (ref 0.0–40.0)

## 2023-12-11 LAB — HEPB+HEPC+HIV PANEL
HIV Screen 4th Generation wRfx: NONREACTIVE
Hep B C IgM: NEGATIVE
Hep B Core Total Ab: NEGATIVE
Hep B E Ab: NONREACTIVE
Hep B E Ag: NEGATIVE
Hep B Surface Ab, Qual: REACTIVE
Hep C Virus Ab: NONREACTIVE
Hepatitis B Surface Ag: NEGATIVE

## 2023-12-11 LAB — CBC WITH DIFFERENTIAL/PLATELET
Basophils Absolute: 0.1 10*3/uL (ref 0.0–0.1)
Basophils Relative: 1.3 % (ref 0.0–3.0)
Eosinophils Absolute: 0.1 10*3/uL (ref 0.0–0.7)
Eosinophils Relative: 1.8 % (ref 0.0–5.0)
HCT: 36.8 % (ref 36.0–46.0)
Hemoglobin: 12 g/dL (ref 12.0–15.0)
Lymphocytes Relative: 52.3 % — ABNORMAL HIGH (ref 12.0–46.0)
Lymphs Abs: 2.5 10*3/uL (ref 0.7–4.0)
MCHC: 32.6 g/dL (ref 30.0–36.0)
MCV: 88 fL (ref 78.0–100.0)
Monocytes Absolute: 0.3 10*3/uL (ref 0.1–1.0)
Monocytes Relative: 6.1 % (ref 3.0–12.0)
Neutro Abs: 1.8 10*3/uL (ref 1.4–7.7)
Neutrophils Relative %: 38.5 % — ABNORMAL LOW (ref 43.0–77.0)
Platelets: 271 10*3/uL (ref 150.0–400.0)
RBC: 4.18 Mil/uL (ref 3.87–5.11)
RDW: 16.5 % — ABNORMAL HIGH (ref 11.5–15.5)
WBC: 4.8 10*3/uL (ref 4.0–10.5)

## 2023-12-12 ENCOUNTER — Other Ambulatory Visit: Payer: Self-pay | Admitting: Nurse Practitioner

## 2023-12-12 ENCOUNTER — Other Ambulatory Visit (HOSPITAL_BASED_OUTPATIENT_CLINIC_OR_DEPARTMENT_OTHER): Payer: Self-pay

## 2023-12-12 ENCOUNTER — Encounter: Payer: Self-pay | Admitting: Nurse Practitioner

## 2023-12-12 LAB — CERVICOVAGINAL ANCILLARY ONLY
Bacterial Vaginitis (gardnerella): POSITIVE — AB
Chlamydia: NEGATIVE
Comment: NEGATIVE
Comment: NEGATIVE
Comment: NEGATIVE
Comment: NORMAL
Neisseria Gonorrhea: NEGATIVE
Trichomonas: NEGATIVE

## 2023-12-12 LAB — HSV 2 ANTIBODY, IGG: HSV 2 Glycoprotein G Ab, IgG: <0.9 {index}

## 2023-12-12 LAB — RPR: RPR Ser Ql: NONREACTIVE

## 2023-12-12 MED ORDER — METRONIDAZOLE 0.75 % VA GEL
1.0000 | Freq: Every day | VAGINAL | 0 refills | Status: DC
  Filled 2023-12-12: qty 70, 7d supply, fill #0

## 2023-12-17 DIAGNOSIS — F902 Attention-deficit hyperactivity disorder, combined type: Secondary | ICD-10-CM | POA: Diagnosis not present

## 2023-12-17 DIAGNOSIS — F3281 Premenstrual dysphoric disorder: Secondary | ICD-10-CM | POA: Diagnosis not present

## 2023-12-17 DIAGNOSIS — F4312 Post-traumatic stress disorder, chronic: Secondary | ICD-10-CM | POA: Diagnosis not present

## 2023-12-24 DIAGNOSIS — F4312 Post-traumatic stress disorder, chronic: Secondary | ICD-10-CM | POA: Diagnosis not present

## 2023-12-24 DIAGNOSIS — F3281 Premenstrual dysphoric disorder: Secondary | ICD-10-CM | POA: Diagnosis not present

## 2023-12-24 DIAGNOSIS — F902 Attention-deficit hyperactivity disorder, combined type: Secondary | ICD-10-CM | POA: Diagnosis not present

## 2023-12-25 ENCOUNTER — Telehealth: Payer: Self-pay | Admitting: Internal Medicine

## 2023-12-25 DIAGNOSIS — G4733 Obstructive sleep apnea (adult) (pediatric): Secondary | ICD-10-CM

## 2023-12-25 NOTE — Telephone Encounter (Signed)
PT is a former [employer] [facility] and never heard back from [place] after her Titration 6 mo ago. I have set an appt for her w/Dr. Maple Hudson but she would like to know sleep study results. Her # is [phone]

## 2023-12-27 NOTE — Telephone Encounter (Signed)
Dr Maple Hudson- can you advise on results now or does she need to wait until she comes in? Her appt with you is not scheduled until April 2025 and her study was done Nov 2024

## 2023-12-27 NOTE — Telephone Encounter (Signed)
She had been followed by Dr Craige Cotta. Her CPAP titration showed control at a pressure of 9. If she needs adjustment before I see her, please ask her DME to set autopap 5-15, refit mask of choice, humidigfier, supplies, AirView/ card.

## 2023-12-30 NOTE — Telephone Encounter (Signed)
 I called and spoke with pt and notified of response from Dr Maple Hudson  She verbalized understanding  She does not feel like settings need to be changed, but liked the mask that she wore during titration study  She is asking if we can send order to [facility] for that specific mask  Dr Maple Hudson, please advise, thanks!

## 2023-12-30 NOTE — Telephone Encounter (Signed)
 Ok, I have sent the order to [facility]

## 2023-12-31 DIAGNOSIS — F4312 Post-traumatic stress disorder, chronic: Secondary | ICD-10-CM | POA: Diagnosis not present

## 2023-12-31 DIAGNOSIS — F3281 Premenstrual dysphoric disorder: Secondary | ICD-10-CM | POA: Diagnosis not present

## 2023-12-31 DIAGNOSIS — F902 Attention-deficit hyperactivity disorder, combined type: Secondary | ICD-10-CM | POA: Diagnosis not present

## 2024-01-21 DIAGNOSIS — F4312 Post-traumatic stress disorder, chronic: Secondary | ICD-10-CM | POA: Diagnosis not present

## 2024-01-21 DIAGNOSIS — F902 Attention-deficit hyperactivity disorder, combined type: Secondary | ICD-10-CM | POA: Diagnosis not present

## 2024-01-21 DIAGNOSIS — F3281 Premenstrual dysphoric disorder: Secondary | ICD-10-CM | POA: Diagnosis not present

## 2024-01-24 ENCOUNTER — Other Ambulatory Visit (HOSPITAL_COMMUNITY): Payer: Self-pay

## 2024-01-28 DIAGNOSIS — F3281 Premenstrual dysphoric disorder: Secondary | ICD-10-CM | POA: Diagnosis not present

## 2024-01-28 DIAGNOSIS — F4312 Post-traumatic stress disorder, chronic: Secondary | ICD-10-CM | POA: Diagnosis not present

## 2024-01-28 DIAGNOSIS — F902 Attention-deficit hyperactivity disorder, combined type: Secondary | ICD-10-CM | POA: Diagnosis not present

## 2024-01-29 ENCOUNTER — Ambulatory Visit: Admitting: Family

## 2024-01-31 ENCOUNTER — Other Ambulatory Visit (HOSPITAL_BASED_OUTPATIENT_CLINIC_OR_DEPARTMENT_OTHER): Payer: Self-pay

## 2024-01-31 MED ORDER — SERTRALINE HCL 50 MG PO TABS
50.0000 mg | ORAL_TABLET | Freq: Every day | ORAL | 0 refills | Status: DC
  Filled 2024-01-31: qty 90, 90d supply, fill #0

## 2024-02-03 DIAGNOSIS — F4312 Post-traumatic stress disorder, chronic: Secondary | ICD-10-CM | POA: Diagnosis not present

## 2024-02-03 DIAGNOSIS — F3281 Premenstrual dysphoric disorder: Secondary | ICD-10-CM | POA: Diagnosis not present

## 2024-02-03 DIAGNOSIS — F902 Attention-deficit hyperactivity disorder, combined type: Secondary | ICD-10-CM | POA: Diagnosis not present

## 2024-02-04 ENCOUNTER — Other Ambulatory Visit (HOSPITAL_COMMUNITY)
Admission: RE | Admit: 2024-02-04 | Discharge: 2024-02-04 | Disposition: A | Discharge status: Home / Self Care | Source: Ambulatory Visit | Attending: Family | Admitting: Family

## 2024-02-04 ENCOUNTER — Ambulatory Visit: Admitting: Family

## 2024-02-04 VITALS — BP 109/66 | HR 68 | Temp 98.7°F | Resp 16 | Ht 65.0 in | Wt 209.0 lb

## 2024-02-04 DIAGNOSIS — N760 Acute vaginitis: Secondary | ICD-10-CM | POA: Insufficient documentation

## 2024-02-04 DIAGNOSIS — F3281 Premenstrual dysphoric disorder: Secondary | ICD-10-CM | POA: Diagnosis not present

## 2024-02-04 DIAGNOSIS — Z113 Encounter for screening for infections with a predominantly sexual mode of transmission: Secondary | ICD-10-CM

## 2024-02-04 DIAGNOSIS — F4312 Post-traumatic stress disorder, chronic: Secondary | ICD-10-CM | POA: Diagnosis not present

## 2024-02-04 DIAGNOSIS — F902 Attention-deficit hyperactivity disorder, combined type: Secondary | ICD-10-CM | POA: Diagnosis not present

## 2024-02-04 NOTE — Assessment & Plan Note (Signed)
 Swab obtained for ancillary testing. Further recommendations following review of results.

## 2024-02-04 NOTE — Progress Notes (Signed)
 Subjective:     Patient ID: Dorothy [name], female    DOB: [birth year], 28 y.o.   MRN: [id number]  Chief Complaint  Patient presents with   STD screening    Patient here to get tested for STD, denies any symptoms    HPI  Discussed the use of AI scribe software for clinical note transcription with the patient, who gave verbal consent to proceed.  History of Present Illness   Patient presents today to discuss vaginal burning.  She reports a vaginal odor as well.  No abnormal discharge. Requesting STD screening.   Reports that mood is much improved on sertraline.    Health Maintenance Due  Topic Date Due   COVID-19 Vaccine (3 - 2024-25 season) 07/07/2023    Past Medical History:  Diagnosis Date   Anemia    Narcolepsy    Sleep apnea    Uses a CPAP     No past surgical history on file.  Family History  Problem Relation Age of Onset   Learning disabilities Mother    Diabetes Father    Rheum arthritis Sister    Diabetes type I Brother    Diabetes Maternal Grandmother    Stroke Maternal Grandmother    Hypertension Maternal Grandmother    Heart disease Maternal Grandfather    Diabetes Paternal Grandmother        ? copd   Cancer Paternal Grandfather        unknown   Colon cancer Neg Hx    Esophageal cancer Neg Hx    Rectal cancer Neg Hx    Stomach cancer Neg Hx     Social History   Socioeconomic History   Marital status: Single    Spouse name: Not on file   Number of children: Not on file   Years of education: Not on file   Highest education level: Bachelor's degree (e.g., BA, AB, BS)  Occupational History   Not on file  Tobacco Use   Smoking status: Never   Smokeless tobacco: Never  Vaping Use   Vaping status: Never Used  Substance and Sexual Activity   Alcohol use: Yes    Comment: occasional 1-2   Drug use: No   Sexual activity: Not Currently    Partners: Male    Birth control/protection: Condom  Other Topics Concern   Not on file  Social  History Narrative   Lives with mom and her younger sister   Will begin at [facility] [employer] for Speech pathology   Enjoys reading, music, color, spend time with family     Social Drivers of Health   Financial Resource Strain: Low Risk  (11/08/2023)   Overall Financial Resource Strain (CARDIA)    Difficulty of Paying Living Expenses: Not hard at all  Food Insecurity: No Food Insecurity (11/08/2023)   Hunger Vital Sign    Worried About Running Out of Food in the Last Year: Never true    Ran Out of Food in the Last Year: Never true  Transportation Needs: No Transportation Needs (11/08/2023)   PRAPARE - Administrator, Civil Service (Medical): No    Lack of Transportation (Non-Medical): No  Physical Activity: Insufficiently Active (11/08/2023)   Exercise Vital Sign    Days of Exercise per Week: 2 days    Minutes of Exercise per Session: 30 min  Stress: No Stress Concern Present (11/08/2023)   [name] of Occupational Health - Occupational Stress Questionnaire    Feeling of Stress : Only  a little  Social Connections: Moderately Integrated (11/08/2023)   Social Connection and Isolation Panel [NHANES]    Frequency of Communication with Friends and Family: Three times a week    Frequency of Social Gatherings with Friends and Family: Twice a week    Attends Religious Services: 1 to 4 times per year    Active Member of [employer] or Organizations: Yes    Attends Banker Meetings: 1 to 4 times per year    Marital Status: Never married  Recent Concern: Social Connections - Moderately Isolated (08/20/2023)   Social Connection and Isolation Panel [NHANES]    Frequency of Communication with Friends and Family: Twice a week    Frequency of Social Gatherings with Friends and Family: Once a week    Attends Religious Services: 1 to 4 times per year    Active Member of [employer] or Organizations: No    Attends Engineer, structural: Not on file    Marital Status: Never married  Intimate  Partner Violence: Not on file    Outpatient Medications Prior to Visit  Medication Sig Dispense Refill   modafinil (PROVIGIL) 200 MG tablet Take 1 tablet (200 mg total) by mouth every morning AND 0.5-1 tablets (100-200 mg total) daily at 12 noon. 60 tablet 1   sertraline (ZOLOFT) 50 MG tablet Take 1 tablet by mouth daily 90 tablet 0   metroNIDAZOLE (METROGEL) 0.75 % vaginal gel Place 1 Applicatorful vaginally at bedtime. 70 g 0   No facility-administered medications prior to visit.    Not on File  ROS    See HPI Objective:    Physical Exam Exam conducted with a chaperone present.  Constitutional:      General: She is not in acute distress.    Appearance: Normal appearance. She is well-developed.  HENT:     Head: Normocephalic and atraumatic.     Right Ear: External ear normal.     Left Ear: External ear normal.  Eyes:     General: No scleral icterus. Neck:     Thyroid: No thyromegaly.  Cardiovascular:     Rate and Rhythm: Normal rate and regular rhythm.     Heart sounds: Normal heart sounds. No murmur heard. Pulmonary:     Effort: Pulmonary effort is normal. No respiratory distress.     Breath sounds: Normal breath sounds. No wheezing.  Genitourinary:    General: Normal vulva.  Musculoskeletal:     Cervical back: Neck supple.  Skin:    General: Skin is warm and dry.  Neurological:     Mental Status: She is alert and oriented to person, place, and time.  Psychiatric:        Mood and Affect: Mood normal.        Behavior: Behavior normal.        Thought Content: Thought content normal.        Judgment: Judgment normal.      BP 109/66 (BP Location: Right Arm, Patient Position: Sitting, Cuff Size: Large)   Pulse 68   Temp 98.7 F (37.1 C) (Oral)   Resp 16   Ht 5\' 5"  (1.651 m)   Wt 209 lb (94.8 kg)   SpO2 99%   BMI 34.78 kg/m  Wt Readings from Last 3 Encounters:  02/04/24 209 lb (94.8 kg)  12/10/23 207 lb (93.9 kg)  10/01/23 210 lb 2.6 oz (95.3 kg)        Assessment & Plan:   Problem List Items Addressed This  Visit       Unprioritized   PMDD (premenstrual dysphoric disorder)   Stable on sertraline, continue same.       Acute vaginitis - Primary   Swab obtained for ancillary testing. Further recommendations following review of results.       Relevant Orders   Cervicovaginal ancillary only( [place])   Other Visit Diagnoses       Screening examination for STD (sexually transmitted disease)       Relevant Orders   HIV antibody (with reflex)   RPR       I have discontinued Theodosia Blender D. Knaus's metroNIDAZOLE. I am also having her maintain her modafinil and sertraline.  No orders of the defined types were placed in this encounter.

## 2024-02-04 NOTE — Assessment & Plan Note (Signed)
Stable on sertraline, continue same.  ?

## 2024-02-04 NOTE — Progress Notes (Addendum)
 Established Patient Office Visit  Subjective   Patient ID: Dorothy [name], female    DOB: 1996-07-12  Age: 28 y.o. MRN: [id number]  Chief Complaint  Patient presents with   STD screening    Patient here to get tested for STD, denies any symptoms    Pleasant 28 yo patient presents c/o intermittent vaginal burning for 1-2 weeks. Also, states she has noticed an unusual vaginal odor. Patient also reports dyspareunia x 1 year. She denies vaginal discharge, dysuria, or flank pain. She is scheduled to see her gynecologist next week.       Review of Systems  Constitutional:  Negative for chills and fever.  HENT:  Negative for hearing loss and nosebleeds.   Eyes:  Negative for blurred vision and double vision.  Respiratory:  Negative for cough and shortness of breath.   Cardiovascular:  Negative for chest pain and palpitations.  Gastrointestinal:  Negative for nausea and vomiting.  Genitourinary:  Negative for dysuria, flank pain and urgency.  Musculoskeletal:  Negative for joint pain and myalgias.  Skin:  Negative for rash.  Neurological:  Negative for dizziness and headaches.  Endo/Heme/Allergies:  Does not bruise/bleed easily.  Psychiatric/Behavioral:  Negative for depression.       Objective:     BP 109/66 (BP Location: Right Arm, Patient Position: Sitting, Cuff Size: Large)   Pulse 68   Temp 98.7 F (37.1 C) (Oral)   Resp 16   Ht 5\' 5"  (1.651 m)   Wt 209 lb (94.8 kg)   SpO2 99%   BMI 34.78 kg/m    Physical Exam Vitals reviewed. Exam conducted with a chaperone present.  Constitutional:      General: She is not in acute distress.    Appearance: Normal appearance. She is not ill-appearing.  HENT:     Head: Normocephalic and atraumatic.     Right Ear: External ear normal.     Left Ear: External ear normal.     Nose: Nose normal.  Eyes:     Pupils: Pupils are equal, round, and reactive to light.  Cardiovascular:     Rate and Rhythm: Normal rate and regular  rhythm.     Pulses: Normal pulses.     Heart sounds: Normal heart sounds.  Pulmonary:     Effort: Pulmonary effort is normal.     Breath sounds: Normal breath sounds.  Abdominal:     General: Bowel sounds are normal.     Palpations: Abdomen is soft.  Genitourinary:    General: Normal vulva.     Exam position: Lithotomy position.     Labia:        Right: No rash or tenderness.        Left: No rash or tenderness.      Vagina: No vaginal discharge.  Skin:    General: Skin is warm and dry.     Capillary Refill: Capillary refill takes less than 2 seconds.  Neurological:     Mental Status: She is alert and oriented to person, place, and time.  Psychiatric:        Mood and Affect: Mood normal.        Behavior: Behavior normal.     No results found for any visits on 02/04/24.    The ASCVD Risk score (Arnett DK, et al., 2019) failed to calculate for the following reasons:   The 2019 ASCVD risk score is only valid for ages 1 to 37    Assessment & Plan:  Problem List Items Addressed This Visit       Genitourinary   Acute vaginitis - Primary   Swab obtained for ancillary testing. Further recommendations following review of results.       Relevant Orders   Cervicovaginal ancillary only( [place])     Other   PMDD (premenstrual dysphoric disorder)   Stable on sertraline, continue same.       Other Visit Diagnoses       Screening examination for STD (sexually transmitted disease)       Relevant Orders   HIV antibody (with reflex)   RPR        Cristopher Peru, RN

## 2024-02-05 ENCOUNTER — Other Ambulatory Visit (HOSPITAL_BASED_OUTPATIENT_CLINIC_OR_DEPARTMENT_OTHER): Payer: Self-pay

## 2024-02-05 ENCOUNTER — Telehealth: Payer: Self-pay

## 2024-02-05 ENCOUNTER — Telehealth: Payer: Self-pay | Admitting: Family

## 2024-02-05 DIAGNOSIS — G4733 Obstructive sleep apnea (adult) (pediatric): Secondary | ICD-10-CM

## 2024-02-05 LAB — RPR: RPR Ser Ql: NONREACTIVE

## 2024-02-05 LAB — CERVICOVAGINAL ANCILLARY ONLY
Bacterial Vaginitis (gardnerella): POSITIVE — AB
Candida Glabrata: NEGATIVE
Candida Vaginitis: NEGATIVE
Chlamydia: NEGATIVE
Comment: NEGATIVE
Comment: NEGATIVE
Comment: NEGATIVE
Comment: NEGATIVE
Comment: NEGATIVE
Comment: NORMAL
Neisseria Gonorrhea: NEGATIVE
Trichomonas: NEGATIVE

## 2024-02-05 LAB — HIV ANTIBODY (ROUTINE TESTING W REFLEX): HIV 1&2 Ab, 4th Generation: NONREACTIVE

## 2024-02-05 MED ORDER — METRONIDAZOLE 0.75 % VA GEL
1.0000 | Freq: Every day | VAGINAL | 0 refills | Status: AC
  Filled 2024-02-05 – 2024-02-06 (×2): qty 70, 7d supply, fill #0

## 2024-02-05 NOTE — Telephone Encounter (Signed)
 Swab positive for BV. Rx sent for metrogel.

## 2024-02-05 NOTE — Progress Notes (Unsigned)
 05/28/23- Dr Craige Cotta:  She was originally diagnosed with sleep apnea and narcolepsy in 2013.  She has been followed by sleep medicine with [facility] in [place].  She started having trouble in school.  Her teacher told her parents that she would fall asleep in class.  This lead to further assessment.  Initially she didn't realize she had sleep apnea, and just thought she had narcolepsy.  She had previous episodes of cataplexy when she gets anxious or stressed.  This also happened once during intercourse.  She has experienced episodes of sleep paralysis, but these don't seem to happen as frequently as before.  She used to have episodes of dreams coming to life when she was younger, but again not as frequent as before.  She has been using provigil and this helps some, but she still struggles to stay awake at times.  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  She has 10 hour shifts and this can be difficult to keep up with.  She usually can fall asleep easily, but she is trouble staying asleep, and frequently wakes up during the night.  She had a repeat sleep study in 2018 and this showed mild sleep apnea.  She was started on CPAP therapy.  She had a [employer]ics device.  This was recalled and she got a replacement device from [employer].  She doesn't feel like the mask fits properly, and she gets heavy feeling in her chest from using CPAP.  She goes to sleep between 9 and 11 pm.  She falls asleep in 5 to 10 minutes.  She wakes up some times to use the bathroom.  She gets out of bed at 630 am.  She feels tired in the morning.  She denies morning headache.  She denies sleep walking, sleep talking, bruxism, or nightmares.  There is no history of restless legs.  The Epworth score is 17 out of 24.  She is followed by behavioral health for depression.  She has been using zoloft for the past month and this has helped  some.  ========================================================================================= PSG 10/13/12 AHI 2.7/hr MSLT 10/14/12  Mean Latency  1.6 minutes, 4 SOREMs PSG 07/25/17  AHI 9.8/hr CPAP Titration- 09/10/23- to 9 cwp, minimum O2 on CPAP 91%, body weight 210 lbs ========================================================================================== 02/06/24- 27 yoF never smoker transferring care for Narcolepsy and OSA, previously followed by Dr Craige Cotta Medical problem list includes Hyperlipidemia, PTSD,  - modafinil 200 mg AM/ 1/2 (100 mg) noon, Zoloft Denies pregnancy. CPAP 6 / [facility]    DreamStation 2 Auto machine >>today changing to auto 4-10 Discussed the use of AI scribe software for clinical note transcription with the patient, who gave verbal consent to proceed.  History of Present Illness   The patient, with a history of narcolepsy and obstructive sleep apnea, presents with difficulty adjusting to her CPAP mask and issues with sleep. She reports feeling claustrophobic with the full face mask that goes over the head and has recently switched to a full face mask that only goes down. Despite this change, she still struggles with the adjustment and is trying to wear it during naps and before sleep to get used to it.  In addition to the mask discomfort, the patient reports difficulty staying asleep. She describes nights where she cannot sleep at all or wakes up every hour or two. She has tried to improve her sleep by avoiding long naps during the day, but sometimes finds it hard to get through  the day without a nap. She also reports that she sometimes skips her afternoon dose of modafinil, a medication for narcolepsy, especially when she is not working or doing anything during the day.     ROS-see HPI   + = positive Constitutional:    weight loss, night sweats, fevers, chills, fatigue, lassitude. HEENT:    headaches, difficulty swallowing, tooth/dental problems, sore  throat,       sneezing, itching, ear ache, nasal congestion, post nasal drip, snoring CV:    chest pain, orthopnea, PND, swelling in lower extremities, anasarca,                                   dizziness, palpitations Resp:   shortness of breath with exertion or at rest.                productive cough,   non-productive cough, coughing up of blood.              change in color of mucus.  wheezing.   Skin:    rash or lesions. GI:  No-   heartburn, indigestion, abdominal pain, nausea, vomiting, diarrhea,                 change in bowel habits, loss of appetite GU: dysuria, change in color of urine, no urgency or frequency.   flank pain. MS:   joint pain, stiffness, decreased range of motion, back pain. Neuro-     nothing unusual Psych:  change in mood or affect.  depression or anxiety.   memory loss.  OBJ- Physical Exam General- Alert, Oriented, Affect-appropriate, Distress- none acute, +overweight Skin- rash-none, lesions- none, excoriation- none Lymphadenopathy- none Head- atraumatic            Eyes- Gross vision intact, PERRLA, conjunctivae and secretions clear            Ears- Hearing, canals-normal            Nose- Clear, no-Septal dev, mucus, polyps, erosion, perforation             Throat- Mallampati III , mucosa clear , drainage- none, tonsils+residual, +teeth Neck- flexible , trachea midline, no stridor , thyroid nl, carotid no bruit Chest - symmetrical excursion , unlabored           Heart/CV- RRR , no murmur , no gallop  , no rub, nl s1 s2                           - JVD- none , edema- none, stasis changes- none, varices- none           Lung- clear to P&A, wheeze- none, cough- none , dullness-none, rub- none           Chest wall-  Abd-  Br/ Gen/ Rectal- Not done, not indicated Extrem- cyanosis- none, clubbing, none, atrophy- none, strength- nl Neuro- grossly intact to observation  Assessment and Plan:   Need to see if comfortable sleep apnea control has much impact on  daytime sleepiness. Consider oral appliance option, weight loss,etc. Obstructive Sleep Apnea Difficulty with CPAP mask adjustment affects treatment efficacy. Mild obstructive sleep apnea with AHI of nine. Current CPAP settings inadequate. Claustrophobia impacts compliance. - Request [facility] to change CPAP setting to auto PAP with a range of 4 to 10 cm H2O. - Arrange face-to-face mask refitting with [facility] for more  comfortable options.  Narcolepsy Narcolepsy causes daytime sleepiness and disrupted nighttime sleep. Modafinil regimen in place. Open to additional nighttime medication. - Continue modafinil 200 mg in the morning and 100 mg in the afternoon as needed. - Prescribe zolpidem 5 mg, option to take one or two tablets before bedtime. - Educate on sleep hygiene practices.  Follow-up Requires ongoing management of sleep disorders and medication adjustments. - Schedule follow-up in six months unless issues arise sooner.

## 2024-02-05 NOTE — Telephone Encounter (Signed)
Patient notified of results and rx 

## 2024-02-05 NOTE — Telephone Encounter (Addendum)
 Called patient.  Patient has an OV with Dr. Maple Hudson tomorrow morning at 9:00 am.  We are trying to get a compliance download on patients CPAP.  Patients DME is [facility] in [place], [place]. We are not able to get any data on patient in [facility].  Per patient,  she got a new CPAP and she thinks [facility] is still monitoring her old unit.  Patient does not think [facility] has enrolled patient in [facility] or downloaded the software into her new CPAP unit.  She also checked her machine and there is no SD card in the CPAP. Per Dr. Maple Hudson, please put a DME order into [facility] to have patient take her CPAP machine to [facility] today to have them download all data for machine to start sending data to [facility] and also to give patient a SD card to put in CPAP unit as a backup for data. Patient states she is fine with going to [facility] in [facility] today to do this.  Gave patient the address and phone number for [facility] in [place], [place].   Patient verbalized understanding.

## 2024-02-06 ENCOUNTER — Other Ambulatory Visit (HOSPITAL_BASED_OUTPATIENT_CLINIC_OR_DEPARTMENT_OTHER): Payer: Self-pay

## 2024-02-06 ENCOUNTER — Other Ambulatory Visit: Payer: Self-pay

## 2024-02-06 ENCOUNTER — Other Ambulatory Visit (HOSPITAL_COMMUNITY): Payer: Self-pay

## 2024-02-06 ENCOUNTER — Ambulatory Visit: Admitting: Internal Medicine

## 2024-02-06 ENCOUNTER — Encounter: Payer: Self-pay | Admitting: Internal Medicine

## 2024-02-06 VITALS — BP 106/68 | HR 84 | Temp 98.1°F | Ht 65.0 in | Wt 210.2 lb

## 2024-02-06 DIAGNOSIS — G4733 Obstructive sleep apnea (adult) (pediatric): Secondary | ICD-10-CM | POA: Diagnosis not present

## 2024-02-06 MED ORDER — MODAFINIL 200 MG PO TABS
ORAL_TABLET | ORAL | 3 refills | Status: DC
  Filled 2024-02-06 (×2): qty 60, 30d supply, fill #0
  Filled 2024-05-14 – 2024-06-05 (×2): qty 60, 30d supply, fill #1

## 2024-02-06 MED ORDER — ZOLPIDEM TARTRATE 10 MG PO TABS
10.0000 mg | ORAL_TABLET | Freq: Every evening | ORAL | 5 refills | Status: DC | PRN
  Filled 2024-02-06: qty 60, 30d supply, fill #0

## 2024-02-06 MED ORDER — ZOLPIDEM TARTRATE 5 MG PO TABS
5.0000 mg | ORAL_TABLET | Freq: Every evening | ORAL | 3 refills | Status: AC | PRN
  Filled 2024-02-06 (×2): qty 30, 30d supply, fill #0
  Filled 2024-06-05: qty 30, 30d supply, fill #1

## 2024-02-06 NOTE — Patient Instructions (Addendum)
 Order DME [facility]- please change CPAP to auto 4-10. Please bring patient in for face to face mask of choice refit for comfort and compliance.  Script sent refilling modafinil to continue as you have been.  Script sent to try ambien 5 mg- 1 or 2 tabs at bedtime for sleep as needed

## 2024-02-07 ENCOUNTER — Other Ambulatory Visit: Payer: Self-pay

## 2024-02-07 ENCOUNTER — Other Ambulatory Visit (HOSPITAL_COMMUNITY): Payer: Self-pay

## 2024-02-11 DIAGNOSIS — F3281 Premenstrual dysphoric disorder: Secondary | ICD-10-CM | POA: Diagnosis not present

## 2024-02-11 DIAGNOSIS — F902 Attention-deficit hyperactivity disorder, combined type: Secondary | ICD-10-CM | POA: Diagnosis not present

## 2024-02-11 DIAGNOSIS — F4312 Post-traumatic stress disorder, chronic: Secondary | ICD-10-CM | POA: Diagnosis not present

## 2024-02-13 ENCOUNTER — Encounter: Payer: Commercial Managed Care - PPO | Admitting: Internal Medicine

## 2024-02-18 DIAGNOSIS — F3281 Premenstrual dysphoric disorder: Secondary | ICD-10-CM | POA: Diagnosis not present

## 2024-02-18 DIAGNOSIS — F4312 Post-traumatic stress disorder, chronic: Secondary | ICD-10-CM | POA: Diagnosis not present

## 2024-02-18 DIAGNOSIS — F902 Attention-deficit hyperactivity disorder, combined type: Secondary | ICD-10-CM | POA: Diagnosis not present

## 2024-02-24 DIAGNOSIS — F4312 Post-traumatic stress disorder, chronic: Secondary | ICD-10-CM | POA: Diagnosis not present

## 2024-02-24 DIAGNOSIS — F3281 Premenstrual dysphoric disorder: Secondary | ICD-10-CM | POA: Diagnosis not present

## 2024-02-24 DIAGNOSIS — F902 Attention-deficit hyperactivity disorder, combined type: Secondary | ICD-10-CM | POA: Diagnosis not present

## 2024-03-02 DIAGNOSIS — F3281 Premenstrual dysphoric disorder: Secondary | ICD-10-CM | POA: Diagnosis not present

## 2024-03-02 DIAGNOSIS — F902 Attention-deficit hyperactivity disorder, combined type: Secondary | ICD-10-CM | POA: Diagnosis not present

## 2024-03-02 DIAGNOSIS — F4312 Post-traumatic stress disorder, chronic: Secondary | ICD-10-CM | POA: Diagnosis not present

## 2024-03-09 DIAGNOSIS — F902 Attention-deficit hyperactivity disorder, combined type: Secondary | ICD-10-CM | POA: Diagnosis not present

## 2024-03-09 DIAGNOSIS — F4312 Post-traumatic stress disorder, chronic: Secondary | ICD-10-CM | POA: Diagnosis not present

## 2024-03-09 DIAGNOSIS — F3281 Premenstrual dysphoric disorder: Secondary | ICD-10-CM | POA: Diagnosis not present

## 2024-03-10 ENCOUNTER — Ambulatory Visit

## 2024-03-10 ENCOUNTER — Other Ambulatory Visit (HOSPITAL_BASED_OUTPATIENT_CLINIC_OR_DEPARTMENT_OTHER): Payer: Self-pay

## 2024-03-10 VITALS — BP 124/84 | HR 69 | Ht 65.0 in | Wt 213.0 lb

## 2024-03-10 DIAGNOSIS — Z3009 Encounter for other general counseling and advice on contraception: Secondary | ICD-10-CM | POA: Diagnosis not present

## 2024-03-10 DIAGNOSIS — N898 Other specified noninflammatory disorders of vagina: Secondary | ICD-10-CM | POA: Diagnosis not present

## 2024-03-10 DIAGNOSIS — R1084 Generalized abdominal pain: Secondary | ICD-10-CM

## 2024-03-10 MED ORDER — METRONIDAZOLE 0.75 % VA GEL
1.0000 | Freq: Every day | VAGINAL | 1 refills | Status: DC
  Filled 2024-03-10: qty 70, 5d supply, fill #0

## 2024-03-10 NOTE — Patient Instructions (Signed)
Boric Acid Vaginal Suppositories What is this medication? BORIC ACID (BOHR ik AS id) may support vaginal health. It may relieve the symptoms of a yeast and bacterial infection, such as itching, burning, and odor.   COMMON BRAND NAME(S): AZO Boric Acid with Aloe Vera, Hylafem  How should I use this medication?  This medication is for use in the vagina. Do not take by mouth. Wash hands before and after use. Use this medication at bedtime, unless otherwise directed. For recurrent Bacterial Vaginosis, after initial treatment with an anti-infective you should start your Boric Acid suppositories.  Insert one 600mg suppository in the vagina every night for 21 days and then once weekly for 9 weeks. After treatment if you have symptoms, treat with Boric Acid suppository for 3-5 days. If symptoms persist or return shortly after, contact the office for an appt.  Boric acid can cause death if consumed orally. Therefore you should use barrier methods during oral sex when undergoing treatment.   What if I miss a dose? If you miss a dose, use it as soon as you can. If it is almost time for your next dose, use only that dose. Do not use double or extra doses.  What may interact with this medication? Interactions are not expected. Do not use any other vaginal products without telling your care team. This list may not describe all possible interactions. Give your health care provider a list of all the medicines, herbs, non-prescription drugs, or dietary supplements you use. Also tell them if you smoke, drink alcohol, or use illegal drugs. Some items may interact with your medicine.  What should I watch for while using this medication? Tell your care team if your symptoms do not start to get better within a few days. It is better not to have sex until you have finished your treatment. This medication may cause condoms, diaphragms, and spermicides to not work as well. Do not rely on any of these methods to prevent  sexually transmitted infections (STIs) or pregnancy while you are using this medication.  Vaginal medications may come out of the vagina during treatment. To keep the medication from getting on your clothing, wear a panty liner, but change frequently to avoid concurrent infections.  The use of tampons is not recommended. To help clear up the infection, wear freshly washed cotton, not synthetic, underwear.  What side effects may I notice from receiving this medication? Side effects that you should report to your care team as soon as possible: Allergic reactions--skin rash, itching, hives, swelling of the face, lips, tongue, or throat Unusual vaginal discharge, itching, or odor Side effects that usually do not require medical attention (report to your care team if they continue or are bothersome): Vaginal irritation at the application site  This list may not describe all possible side effects. Call your doctor for medical advice about side effects. You may report side effects to FDA at 1-800-FDA-1088. Where should I keep my medication? Keep out of the reach of children and pets. Store in a cool, dry place between 15 and 30 degrees C (59 and 86 degrees F). Keep away from sunlight. Throw away any unused medication after the expiration date.  NOTE: This sheet is a summary. It may not cover all possible information. If you have questions about this medicine, talk to your doctor, pharmacist, or health care provider.  2023 Elsevier/Gold Standard (2021-08-15 00:00:00)  

## 2024-03-10 NOTE — Progress Notes (Signed)
 GYNECOLOGY OFFICE VISIT NOTE  History:  Dorothy [name] is a 28 y.o. G0P0000 here today for birth control counseling.   She reports typical periods is 6-7 days and is "really heavy."  She reports she uses "really big maxi pads" and changes every 2 hours, but are not always saturated.  She states she occasionally passes clots that are not bigger than a quarter.  She endorses cramping and reports "it is really intense."  She states the cramps continue even once periods end and lasts days to a week. She reports she has taken Aleve and other medications with no relief.   She states she has taken the pill in college for "a few months," but didn't have a good experience.  She recalls the period was extended which was a dissatisfier.   She is currently sexually active with female partners.  She reports having 2 partners in the past year.  She endorses condom usage, but not 100% of the time.   She reports "ongoing BV" that is being managed by her primary provider-[facility].   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   She denies any abnormal vaginal discharge, bleeding, or pelvic pain. However, patient does report abdominal cramping that is constant, but worsens with menstrual cycle.  She states she was seen by her PCP and an gastroenterologist and was told it was r/t gynecological issue.  Patient states the pain has no relieving factors and is located in upper abdominal area. Patient denies issues with nutritional intake or worsening symptoms after eating. However, she does report pain causes nausea and occasionally vomiting.  Patient states pain is also noted to be worse after sexual activity, but not always.   Past Medical History:  Diagnosis Date   Anemia    Narcolepsy    Sleep apnea    Uses a CPAP     History reviewed. No pertinent surgical history.  The following portions of the patient's history were reviewed and updated as appropriate: allergies, current medications, past  family history, past medical history, past social history, past surgical history and problem list.   Health Maintenance:  Normal pap and negative HRHPV on May 2023.  Normal mammogram d/t age.  Review of Systems:  Genito-Urinary ROS: negative Gastrointestinal ROS: negative Objective:  Vitals: BP 107/80   Pulse 82   Ht 5\' 5"  (1.651 m)   Wt 213 lb (96.6 kg)   BMI 35.45 kg/m   Physical Exam: Physical Exam Constitutional:      Appearance: Normal appearance.  HENT:     Head: Normocephalic and atraumatic.  Eyes:     Conjunctiva/sclera: Conjunctivae normal.  Cardiovascular:     Rate and Rhythm: Normal rate and regular rhythm.  Pulmonary:     Effort: Pulmonary effort is normal. No respiratory distress.  Abdominal:     Palpations: Abdomen is soft. There is no mass.     Tenderness: There is abdominal tenderness in the right upper quadrant, epigastric area and left upper quadrant.  Musculoskeletal:        General: Normal range of motion.     Cervical back: Normal range of motion.  Neurological:     Mental Status: She is alert and oriented to person, place, and time.  Skin:    General: Skin is warm and dry.  Psychiatric:        Mood and Affect: Mood normal.        Behavior: Behavior normal.  Vitals and nursing note reviewed.  Labs and Imaging: No results found.  Assessment & Plan:  28 year old Female Birth Control Counseling Recurrent BV Generalized Abdominal Cramping  -Reviewed birth control methods by tiered approach including risks, benefits, and possible side effects.  -Discussed possible changes in bleeding pattern that could result in amenorrhea, intermittent spotting, monthly menses, or menorrhagia.  -Discussed and shown information regarding bedsider.org for further research.  -Informed that she can call or send mychart message for birth control requiring no procedure. -Reviewed procedure preparations for IUD based on provider.  -Discussed c/o recurrent BV.  Reports some symptoms today.  Will treat empirically.  Reviewed usage of boric acid to decrease reoccurrence.  Information placed in AVS.  Script sent to pharmacy on file.  -Discussed c/o abdominal cramping /pain.  Informed that symptoms and exam are not reflective of GU origination, but could perform pelvic US  to r/o uterine abnormalities/contributions especially in setting of reported dysmenorrhea. -Patient initially declines, but after further discussion recognizes benefit of r/o and/or identifying any contributing reproductive factors. -Encouraged to make appt with primary provider as well as GI provider for further evaluation.  -RTO prn or in one year for annual exam with pap.    Total face-to-face time with patient:  20  minutes   Loetta Ringer, CNM 03/10/2024 10:30 AM

## 2024-03-16 DIAGNOSIS — F902 Attention-deficit hyperactivity disorder, combined type: Secondary | ICD-10-CM | POA: Diagnosis not present

## 2024-03-16 DIAGNOSIS — F3281 Premenstrual dysphoric disorder: Secondary | ICD-10-CM | POA: Diagnosis not present

## 2024-03-16 DIAGNOSIS — F4312 Post-traumatic stress disorder, chronic: Secondary | ICD-10-CM | POA: Diagnosis not present

## 2024-03-17 ENCOUNTER — Ambulatory Visit (HOSPITAL_BASED_OUTPATIENT_CLINIC_OR_DEPARTMENT_OTHER)

## 2024-03-20 ENCOUNTER — Ambulatory Visit (HOSPITAL_BASED_OUTPATIENT_CLINIC_OR_DEPARTMENT_OTHER)

## 2024-03-20 ENCOUNTER — Telehealth (HOSPITAL_BASED_OUTPATIENT_CLINIC_OR_DEPARTMENT_OTHER): Payer: Self-pay

## 2024-03-20 ENCOUNTER — Encounter (HOSPITAL_BASED_OUTPATIENT_CLINIC_OR_DEPARTMENT_OTHER): Payer: Self-pay

## 2024-03-23 DIAGNOSIS — F4312 Post-traumatic stress disorder, chronic: Secondary | ICD-10-CM | POA: Diagnosis not present

## 2024-03-23 DIAGNOSIS — F902 Attention-deficit hyperactivity disorder, combined type: Secondary | ICD-10-CM | POA: Diagnosis not present

## 2024-03-23 DIAGNOSIS — F3281 Premenstrual dysphoric disorder: Secondary | ICD-10-CM | POA: Diagnosis not present

## 2024-04-06 ENCOUNTER — Encounter: Payer: Self-pay | Admitting: Internal Medicine

## 2024-04-06 DIAGNOSIS — F3281 Premenstrual dysphoric disorder: Secondary | ICD-10-CM | POA: Diagnosis not present

## 2024-04-06 DIAGNOSIS — F902 Attention-deficit hyperactivity disorder, combined type: Secondary | ICD-10-CM | POA: Diagnosis not present

## 2024-04-06 DIAGNOSIS — F4312 Post-traumatic stress disorder, chronic: Secondary | ICD-10-CM | POA: Diagnosis not present

## 2024-04-08 ENCOUNTER — Encounter: Payer: Self-pay | Admitting: Internal Medicine

## 2024-04-08 NOTE — Telephone Encounter (Signed)
 Letter done, ready to mail to Dorothy [name]- on C-pod

## 2024-04-13 DIAGNOSIS — F3281 Premenstrual dysphoric disorder: Secondary | ICD-10-CM | POA: Diagnosis not present

## 2024-04-13 DIAGNOSIS — F4312 Post-traumatic stress disorder, chronic: Secondary | ICD-10-CM | POA: Diagnosis not present

## 2024-04-13 DIAGNOSIS — F902 Attention-deficit hyperactivity disorder, combined type: Secondary | ICD-10-CM | POA: Diagnosis not present

## 2024-04-20 DIAGNOSIS — F902 Attention-deficit hyperactivity disorder, combined type: Secondary | ICD-10-CM | POA: Diagnosis not present

## 2024-04-20 DIAGNOSIS — F4312 Post-traumatic stress disorder, chronic: Secondary | ICD-10-CM | POA: Diagnosis not present

## 2024-04-20 DIAGNOSIS — F3281 Premenstrual dysphoric disorder: Secondary | ICD-10-CM | POA: Diagnosis not present

## 2024-04-27 ENCOUNTER — Other Ambulatory Visit (HOSPITAL_COMMUNITY): Payer: Self-pay

## 2024-04-27 DIAGNOSIS — F3281 Premenstrual dysphoric disorder: Secondary | ICD-10-CM | POA: Diagnosis not present

## 2024-04-27 DIAGNOSIS — F902 Attention-deficit hyperactivity disorder, combined type: Secondary | ICD-10-CM | POA: Diagnosis not present

## 2024-04-27 DIAGNOSIS — F4312 Post-traumatic stress disorder, chronic: Secondary | ICD-10-CM | POA: Diagnosis not present

## 2024-05-06 ENCOUNTER — Encounter: Payer: Self-pay | Admitting: Family

## 2024-05-08 DIAGNOSIS — F902 Attention-deficit hyperactivity disorder, combined type: Secondary | ICD-10-CM | POA: Diagnosis not present

## 2024-05-08 DIAGNOSIS — F4312 Post-traumatic stress disorder, chronic: Secondary | ICD-10-CM | POA: Diagnosis not present

## 2024-05-08 DIAGNOSIS — F3281 Premenstrual dysphoric disorder: Secondary | ICD-10-CM | POA: Diagnosis not present

## 2024-05-11 DIAGNOSIS — F4312 Post-traumatic stress disorder, chronic: Secondary | ICD-10-CM | POA: Diagnosis not present

## 2024-05-11 DIAGNOSIS — F902 Attention-deficit hyperactivity disorder, combined type: Secondary | ICD-10-CM | POA: Diagnosis not present

## 2024-05-11 DIAGNOSIS — F3281 Premenstrual dysphoric disorder: Secondary | ICD-10-CM | POA: Diagnosis not present

## 2024-05-12 ENCOUNTER — Other Ambulatory Visit: Payer: Self-pay

## 2024-05-12 ENCOUNTER — Other Ambulatory Visit (HOSPITAL_BASED_OUTPATIENT_CLINIC_OR_DEPARTMENT_OTHER): Payer: Self-pay

## 2024-05-12 DIAGNOSIS — F902 Attention-deficit hyperactivity disorder, combined type: Secondary | ICD-10-CM | POA: Diagnosis not present

## 2024-05-12 DIAGNOSIS — F4312 Post-traumatic stress disorder, chronic: Secondary | ICD-10-CM | POA: Diagnosis not present

## 2024-05-12 DIAGNOSIS — F3281 Premenstrual dysphoric disorder: Secondary | ICD-10-CM | POA: Diagnosis not present

## 2024-05-12 MED ORDER — SERTRALINE HCL 50 MG PO TABS
50.0000 mg | ORAL_TABLET | Freq: Every day | ORAL | 0 refills | Status: DC
  Filled 2024-05-12 – 2024-08-17 (×3): qty 90, 90d supply, fill #0

## 2024-05-14 ENCOUNTER — Other Ambulatory Visit: Payer: Self-pay

## 2024-05-18 DIAGNOSIS — F3281 Premenstrual dysphoric disorder: Secondary | ICD-10-CM | POA: Diagnosis not present

## 2024-05-18 DIAGNOSIS — F4312 Post-traumatic stress disorder, chronic: Secondary | ICD-10-CM | POA: Diagnosis not present

## 2024-05-18 DIAGNOSIS — F902 Attention-deficit hyperactivity disorder, combined type: Secondary | ICD-10-CM | POA: Diagnosis not present

## 2024-05-19 ENCOUNTER — Ambulatory Visit: Admitting: Family

## 2024-05-25 DIAGNOSIS — F3281 Premenstrual dysphoric disorder: Secondary | ICD-10-CM | POA: Diagnosis not present

## 2024-05-25 DIAGNOSIS — F4312 Post-traumatic stress disorder, chronic: Secondary | ICD-10-CM | POA: Diagnosis not present

## 2024-05-25 DIAGNOSIS — F902 Attention-deficit hyperactivity disorder, combined type: Secondary | ICD-10-CM | POA: Diagnosis not present

## 2024-05-26 ENCOUNTER — Other Ambulatory Visit (HOSPITAL_BASED_OUTPATIENT_CLINIC_OR_DEPARTMENT_OTHER): Payer: Self-pay

## 2024-06-01 DIAGNOSIS — F4312 Post-traumatic stress disorder, chronic: Secondary | ICD-10-CM | POA: Diagnosis not present

## 2024-06-01 DIAGNOSIS — F3281 Premenstrual dysphoric disorder: Secondary | ICD-10-CM | POA: Diagnosis not present

## 2024-06-01 DIAGNOSIS — F902 Attention-deficit hyperactivity disorder, combined type: Secondary | ICD-10-CM | POA: Diagnosis not present

## 2024-06-05 ENCOUNTER — Other Ambulatory Visit (HOSPITAL_BASED_OUTPATIENT_CLINIC_OR_DEPARTMENT_OTHER): Payer: Self-pay

## 2024-06-08 DIAGNOSIS — F4312 Post-traumatic stress disorder, chronic: Secondary | ICD-10-CM | POA: Diagnosis not present

## 2024-06-08 DIAGNOSIS — F3281 Premenstrual dysphoric disorder: Secondary | ICD-10-CM | POA: Diagnosis not present

## 2024-06-08 DIAGNOSIS — F902 Attention-deficit hyperactivity disorder, combined type: Secondary | ICD-10-CM | POA: Diagnosis not present

## 2024-06-15 DIAGNOSIS — F3281 Premenstrual dysphoric disorder: Secondary | ICD-10-CM | POA: Diagnosis not present

## 2024-06-15 DIAGNOSIS — F902 Attention-deficit hyperactivity disorder, combined type: Secondary | ICD-10-CM | POA: Diagnosis not present

## 2024-06-15 DIAGNOSIS — F4312 Post-traumatic stress disorder, chronic: Secondary | ICD-10-CM | POA: Diagnosis not present

## 2024-06-22 DIAGNOSIS — F4312 Post-traumatic stress disorder, chronic: Secondary | ICD-10-CM | POA: Diagnosis not present

## 2024-06-22 DIAGNOSIS — F902 Attention-deficit hyperactivity disorder, combined type: Secondary | ICD-10-CM | POA: Diagnosis not present

## 2024-06-22 DIAGNOSIS — F3281 Premenstrual dysphoric disorder: Secondary | ICD-10-CM | POA: Diagnosis not present

## 2024-06-29 DIAGNOSIS — F3281 Premenstrual dysphoric disorder: Secondary | ICD-10-CM | POA: Diagnosis not present

## 2024-06-29 DIAGNOSIS — F4312 Post-traumatic stress disorder, chronic: Secondary | ICD-10-CM | POA: Diagnosis not present

## 2024-06-29 DIAGNOSIS — F902 Attention-deficit hyperactivity disorder, combined type: Secondary | ICD-10-CM | POA: Diagnosis not present

## 2024-07-08 ENCOUNTER — Other Ambulatory Visit (HOSPITAL_BASED_OUTPATIENT_CLINIC_OR_DEPARTMENT_OTHER): Payer: Self-pay

## 2024-07-08 DIAGNOSIS — F3281 Premenstrual dysphoric disorder: Secondary | ICD-10-CM | POA: Diagnosis not present

## 2024-07-08 DIAGNOSIS — F4312 Post-traumatic stress disorder, chronic: Secondary | ICD-10-CM | POA: Diagnosis not present

## 2024-07-08 DIAGNOSIS — F902 Attention-deficit hyperactivity disorder, combined type: Secondary | ICD-10-CM | POA: Diagnosis not present

## 2024-07-08 MED ORDER — SERTRALINE HCL 50 MG PO TABS
50.0000 mg | ORAL_TABLET | Freq: Every day | ORAL | 0 refills | Status: DC
  Filled 2024-07-08 – 2024-08-01 (×2): qty 90, 90d supply, fill #0

## 2024-07-13 DIAGNOSIS — F902 Attention-deficit hyperactivity disorder, combined type: Secondary | ICD-10-CM | POA: Diagnosis not present

## 2024-07-13 DIAGNOSIS — F4312 Post-traumatic stress disorder, chronic: Secondary | ICD-10-CM | POA: Diagnosis not present

## 2024-07-13 DIAGNOSIS — F3281 Premenstrual dysphoric disorder: Secondary | ICD-10-CM | POA: Diagnosis not present

## 2024-07-20 ENCOUNTER — Other Ambulatory Visit (HOSPITAL_BASED_OUTPATIENT_CLINIC_OR_DEPARTMENT_OTHER): Payer: Self-pay

## 2024-07-20 DIAGNOSIS — F3281 Premenstrual dysphoric disorder: Secondary | ICD-10-CM | POA: Diagnosis not present

## 2024-07-20 DIAGNOSIS — F902 Attention-deficit hyperactivity disorder, combined type: Secondary | ICD-10-CM | POA: Diagnosis not present

## 2024-07-20 DIAGNOSIS — F4312 Post-traumatic stress disorder, chronic: Secondary | ICD-10-CM | POA: Diagnosis not present

## 2024-07-29 ENCOUNTER — Ambulatory Visit: Admitting: Family

## 2024-07-29 ENCOUNTER — Other Ambulatory Visit (HOSPITAL_COMMUNITY): Payer: Self-pay

## 2024-07-29 ENCOUNTER — Other Ambulatory Visit: Payer: Self-pay

## 2024-07-29 VITALS — BP 109/54 | HR 74 | Temp 97.8°F | Resp 16 | Ht 65.0 in | Wt 210.0 lb

## 2024-07-29 DIAGNOSIS — Z113 Encounter for screening for infections with a predominantly sexual mode of transmission: Secondary | ICD-10-CM | POA: Diagnosis not present

## 2024-07-29 DIAGNOSIS — Z23 Encounter for immunization: Secondary | ICD-10-CM | POA: Diagnosis not present

## 2024-07-29 DIAGNOSIS — F3281 Premenstrual dysphoric disorder: Secondary | ICD-10-CM | POA: Diagnosis not present

## 2024-07-29 DIAGNOSIS — G4733 Obstructive sleep apnea (adult) (pediatric): Secondary | ICD-10-CM | POA: Diagnosis not present

## 2024-07-29 NOTE — Progress Notes (Unsigned)
 Subjective:     Patient ID: Dorothy [name], female    DOB: 02-May-1996, 28 y.o.   MRN: [id number]  Chief Complaint  Patient presents with   Std  screening'    HPI  Discussed the use of AI scribe software for clinical note transcription with the patient, who gave verbal consent to proceed.  History of Present Illness Dorothy [name] is a 28 year old female who presents for STD screening.  She is currently on her menstrual period, which may affect the timing of certain tests. She takes sertraline  for mood management and PMDD, experiencing occasional sweating as a side effect. She uses a CPAP machine for sleep apnea, which has improved her symptoms, though she feels claustrophobic with the full face mask.      Health Maintenance Due  Topic Date Due   Influenza Vaccine  06/05/2024   COVID-19 Vaccine (3 - 2025-26 season) 07/06/2024    Past Medical History:  Diagnosis Date   Anemia    Narcolepsy    Sleep apnea    Uses a CPAP     No past surgical history on file.  Family History  Problem Relation Age of Onset   Learning disabilities Mother    Diabetes Father    Rheum arthritis Sister    Diabetes type I Brother    Diabetes Maternal Grandmother    Stroke Maternal Grandmother    Hypertension Maternal Grandmother    Heart disease Maternal Grandfather    Diabetes Paternal Grandmother        ? copd   Cancer Paternal Grandfather        unknown   Colon cancer Neg Hx    Esophageal cancer Neg Hx    Rectal cancer Neg Hx    Stomach cancer Neg Hx     Social History   Socioeconomic History   Marital status: Single    Spouse name: Not on file   Number of children: Not on file   Years of education: Not on file   Highest education level: Bachelor's degree (e.g., BA, AB, BS)  Occupational History   Not on file  Tobacco Use   Smoking status: Never   Smokeless tobacco: Never  Vaping Use   Vaping status: Never Used  Substance and Sexual Activity   Alcohol use: Yes     Comment: occasional 1-2   Drug use: No   Sexual activity: Not Currently    Partners: Male    Birth control/protection: Condom  Other Topics Concern   Not on file  Social History Narrative   Lives with mom and her younger sister   Will begin at [facility] for Speech pathology   Enjoys reading, music, color, spend time with family     Social Drivers of Health   Financial Resource Strain: Low Risk  (07/29/2024)   Overall Financial Resource Strain (CARDIA)    Difficulty of Paying Living Expenses: Not very hard  Food Insecurity: No Food Insecurity (07/29/2024)   Hunger Vital Sign    Worried About Running Out of Food in the Last Year: Never true    Ran Out of Food in the Last Year: Never true  Transportation Needs: Unmet Transportation Needs (07/29/2024)   PRAPARE - Administrator, Civil Service (Medical): Yes    Lack of Transportation (Non-Medical): No  Physical Activity: Insufficiently Active (07/29/2024)   Exercise Vital Sign    Days of Exercise per Week: 2 days    Minutes of Exercise per Session:  30 min  Stress: No Stress Concern Present (07/29/2024)   [name] of Occupational Health - Occupational Stress Questionnaire    Feeling of Stress: Only a little  Social Connections: Moderately Integrated (07/29/2024)   Social Connection and Isolation Panel    Frequency of Communication with Friends and Family: More than three times a week    Frequency of Social Gatherings with Friends and Family: Twice a week    Attends Religious Services: 1 to 4 times per year    Active Member of [employer] or Organizations: Yes    Attends Banker Meetings: 1 to 4 times per year    Marital Status: Never married  Intimate Partner Violence: Not on file    Outpatient Medications Prior to Visit  Medication Sig Dispense Refill   metroNIDAZOLE  (METROGEL ) 0.75 % vaginal gel Place 1 applicatorful vaginally at bedtime for 5 days. 70 g 1   modafinil  (PROVIGIL ) 200 MG tablet Take 1  tablet (200 mg total) by mouth every morning AND 0.5-1 tablets (100-200 mg total) daily at 12 noon. 60 tablet 3   sertraline  (ZOLOFT ) 50 MG tablet Take 1 tablet by mouth daily 90 tablet 0   sertraline  (ZOLOFT ) 50 MG tablet Take 1 tablet by mouth daily 90 tablet 0   zolpidem  (AMBIEN ) 5 MG tablet Take 1 tablet (5 mg total) by mouth at bedtime as needed for sleep. 30 tablet 3   No facility-administered medications prior to visit.    No Known Allergies  ROS See HPI    Objective:    Physical Exam Constitutional:      Appearance: Normal appearance.  Pulmonary:     Effort: Pulmonary effort is normal.  Neurological:     Mental Status: She is alert and oriented to person, place, and time.  Psychiatric:        Mood and Affect: Mood normal.        Behavior: Behavior normal.        Thought Content: Thought content normal.        Judgment: Judgment normal.      BP (!) 109/54 (BP Location: Right Arm, Patient Position: Sitting, Cuff Size: Normal)   Pulse 74   Temp 97.8 F (36.6 C) (Oral)   Resp 16   Ht 5' 5 (1.651 m)   Wt 210 lb (95.3 kg)   SpO2 99%   BMI 34.95 kg/m  Wt Readings from Last 3 Encounters:  07/29/24 210 lb (95.3 kg)  03/10/24 213 lb (96.6 kg)  02/06/24 210 lb 3.2 oz (95.3 kg)       Assessment & Plan:   Problem List Items Addressed This Visit       Unprioritized   Screening examination for STD (sexually transmitted disease) - Primary   Asymptomatic desires testing. Obtain blood work today. On menses so she will return next week for vaginal swab.       Relevant Orders   HepB+HepC+HIV Panel   HSV(herpes simplex vrs) 1+2 ab-IgG   RPR   PMDD (premenstrual dysphoric disorder)   Improved with zoloft , following with psychiatry.       OSA (obstructive sleep apnea)   Doing well with CPAP, following with Dr. Neysa, for sleep medicine.      Flu shot today  I am having Salihah D. Alvia maintain her modafinil , zolpidem , metroNIDAZOLE , sertraline , and  sertraline .  No orders of the defined types were placed in this encounter.

## 2024-07-29 NOTE — Patient Instructions (Signed)
 VISIT SUMMARY:  You came in today for an STD screening. We also discussed your sleep apnea, depression management, and general health maintenance.  YOUR PLAN:  STD SCREENING: You are currently asymptomatic but requested screening. Your menstrual period may affect the timing of certain tests. -We will do blood work for STD screening. -Schedule a follow-up appointment next week for swab testing.  OBSTRUCTIVE SLEEP APNEA: Your CPAP therapy has improved your daytime symptoms, but you feel claustrophobic with the full face mask. -Continue using your CPAP therapy. -We will discuss mask fitting and alternatives at your next appointment with the sleep specialist.  DEPRESSION: Your sertraline  medication is effective for managing your mood and PMDD, though you experience occasional sweating. -Continue taking sertraline  as prescribed by your psychiatrist.  GENERAL HEALTH MAINTENANCE: You are due for a flu vaccination which we gave you today.

## 2024-07-29 NOTE — Assessment & Plan Note (Signed)
 Improved with zoloft , following with psychiatry.

## 2024-07-29 NOTE — Assessment & Plan Note (Signed)
 Asymptomatic desires testing. Obtain blood work today. On menses so she will return next week for vaginal swab.

## 2024-07-29 NOTE — Assessment & Plan Note (Signed)
 Doing well with CPAP, following with Dr. Neysa, for sleep medicine.

## 2024-07-30 LAB — HEPB+HEPC+HIV PANEL
HIV Screen 4th Generation wRfx: NONREACTIVE
Hep B C IgM: NEGATIVE
Hep B Core Total Ab: NEGATIVE
Hep B E Ab: NONREACTIVE
Hep B E Ag: NEGATIVE
Hep B Surface Ab, Qual: REACTIVE
Hep C Virus Ab: NONREACTIVE
Hepatitis B Surface Ag: NEGATIVE

## 2024-07-30 LAB — RPR: RPR Ser Ql: NONREACTIVE

## 2024-07-30 LAB — HERPES SIMPLEX VIRUS 1 AND 2 (IGG),REFLEX HSV-2 INHIBITION
HSV 1 IGG,TYPE SPECIFIC AB: <0.9 {index}
HSV 2 IGG,TYPE SPECIFIC AB: <0.9 {index}

## 2024-07-31 ENCOUNTER — Ambulatory Visit: Payer: Self-pay | Admitting: Family

## 2024-07-31 DIAGNOSIS — F4312 Post-traumatic stress disorder, chronic: Secondary | ICD-10-CM | POA: Diagnosis not present

## 2024-07-31 DIAGNOSIS — F3281 Premenstrual dysphoric disorder: Secondary | ICD-10-CM | POA: Diagnosis not present

## 2024-07-31 DIAGNOSIS — F902 Attention-deficit hyperactivity disorder, combined type: Secondary | ICD-10-CM | POA: Diagnosis not present

## 2024-08-01 ENCOUNTER — Other Ambulatory Visit (HOSPITAL_BASED_OUTPATIENT_CLINIC_OR_DEPARTMENT_OTHER): Payer: Self-pay

## 2024-08-01 ENCOUNTER — Other Ambulatory Visit (HOSPITAL_COMMUNITY): Payer: Self-pay

## 2024-08-03 ENCOUNTER — Other Ambulatory Visit: Payer: Self-pay

## 2024-08-07 ENCOUNTER — Ambulatory Visit: Admitting: Internal Medicine

## 2024-08-07 DIAGNOSIS — F3281 Premenstrual dysphoric disorder: Secondary | ICD-10-CM | POA: Diagnosis not present

## 2024-08-07 DIAGNOSIS — F902 Attention-deficit hyperactivity disorder, combined type: Secondary | ICD-10-CM | POA: Diagnosis not present

## 2024-08-07 DIAGNOSIS — F4312 Post-traumatic stress disorder, chronic: Secondary | ICD-10-CM | POA: Diagnosis not present

## 2024-08-10 DIAGNOSIS — F3281 Premenstrual dysphoric disorder: Secondary | ICD-10-CM | POA: Diagnosis not present

## 2024-08-10 DIAGNOSIS — F902 Attention-deficit hyperactivity disorder, combined type: Secondary | ICD-10-CM | POA: Diagnosis not present

## 2024-08-10 DIAGNOSIS — F4312 Post-traumatic stress disorder, chronic: Secondary | ICD-10-CM | POA: Diagnosis not present

## 2024-08-17 ENCOUNTER — Other Ambulatory Visit (HOSPITAL_BASED_OUTPATIENT_CLINIC_OR_DEPARTMENT_OTHER): Payer: Self-pay

## 2024-08-21 DIAGNOSIS — F3281 Premenstrual dysphoric disorder: Secondary | ICD-10-CM | POA: Diagnosis not present

## 2024-08-21 DIAGNOSIS — F902 Attention-deficit hyperactivity disorder, combined type: Secondary | ICD-10-CM | POA: Diagnosis not present

## 2024-08-21 DIAGNOSIS — F4312 Post-traumatic stress disorder, chronic: Secondary | ICD-10-CM | POA: Diagnosis not present

## 2024-08-24 DIAGNOSIS — F902 Attention-deficit hyperactivity disorder, combined type: Secondary | ICD-10-CM | POA: Diagnosis not present

## 2024-08-24 DIAGNOSIS — F3281 Premenstrual dysphoric disorder: Secondary | ICD-10-CM | POA: Diagnosis not present

## 2024-08-24 DIAGNOSIS — F4312 Post-traumatic stress disorder, chronic: Secondary | ICD-10-CM | POA: Diagnosis not present

## 2024-08-31 DIAGNOSIS — F3281 Premenstrual dysphoric disorder: Secondary | ICD-10-CM | POA: Diagnosis not present

## 2024-08-31 DIAGNOSIS — F902 Attention-deficit hyperactivity disorder, combined type: Secondary | ICD-10-CM | POA: Diagnosis not present

## 2024-08-31 DIAGNOSIS — F4312 Post-traumatic stress disorder, chronic: Secondary | ICD-10-CM | POA: Diagnosis not present

## 2024-09-02 NOTE — Progress Notes (Signed)
 05/28/23- Dr Shellia:  She was originally diagnosed with sleep apnea and narcolepsy in 2013.  She has been followed by sleep medicine with [facility] in [place].  She started having trouble in school.  Her teacher told her parents that she would fall asleep in class.  This lead to further assessment.  Initially she didn't realize she had sleep apnea, and just thought she had narcolepsy.  She had previous episodes of cataplexy when she gets anxious or stressed.  This also happened once during intercourse.  She has experienced episodes of sleep paralysis, but these don't seem to happen as frequently as before.  She used to have episodes of dreams coming to life when she was younger, but again not as frequent as before.  She has been using provigil  and this helps some, but she still struggles to stay awake at times.  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  She has 10 hour shifts and this can be difficult to keep up with.  She usually can fall asleep easily, but she is trouble staying asleep, and frequently wakes up during the night.  She had a repeat sleep study in 2018 and this showed mild sleep apnea.  She was started on CPAP therapy.  She had a [employer] device.  This was recalled and she got a replacement device from [employer].  She doesn't feel like the mask fits properly, and she gets heavy feeling in her chest from using CPAP.  She goes to sleep between 9 and 11 pm.  She falls asleep in 5 to 10 minutes.  She wakes up some times to use the bathroom.  She gets out of bed at 630 am.  She feels tired in the morning.  She denies morning headache.  She denies sleep walking, sleep talking, bruxism, or nightmares.  There is no history of restless legs.  The Epworth score is 17 out of 24.  She is followed by behavioral health for depression.  She has been using zoloft  for the past month and this has helped  some.  ========================================================================================= PSG 10/13/12 AHI 2.7/hr MSLT 10/14/12  Mean Latency  1.6 minutes, 4 SOREMs PSG 07/25/17  AHI 9.8/hr CPAP Titration- 09/10/23- to 9 cwp, minimum O2 on CPAP 91%, body weight 210 lbs ========================================================================================== 02/06/24- 28 yoF never smoker transferring care for Narcolepsy and OSA, previously followed by Dr Shellia Medical problem list includes Hyperlipidemia, PTSD,  - modafinil  200 mg AM/ 1/2 (100 mg) noon, Zoloft  Denies pregnancy. CPAP 6 / [facility]    DreamStation 2 Auto machine >>today changing to auto 4-10 Discussed the use of AI scribe software for clinical note transcription with the patient, who gave verbal consent to proceed.  History of Present Illness   The patient, with a history of narcolepsy and obstructive sleep apnea, presents with difficulty adjusting to her CPAP mask and issues with sleep. She reports feeling claustrophobic with the full face mask that goes over the head and has recently switched to a full face mask that only goes down. Despite this change, she still struggles with the adjustment and is trying to wear it during naps and before sleep to get used to it.  In addition to the mask discomfort, the patient reports difficulty staying asleep. She describes nights where she cannot sleep at all or wakes up every hour or two. She has tried to improve her sleep by avoiding long naps during the day, but sometimes finds it hard to get through  the day without a nap. She also reports that she sometimes skips her afternoon dose of modafinil , a medication for narcolepsy, especially when she is not working or doing anything during the day.    Assessment and Plan:   Need to see if comfortable sleep apnea control has much impact on daytime sleepiness. Consider oral appliance option, weight loss,etc. Obstructive Sleep  Apnea Difficulty with CPAP mask adjustment affects treatment efficacy. Mild obstructive sleep apnea with AHI of nine. Current CPAP settings inadequate. Claustrophobia impacts compliance. - Request [facility] to change CPAP setting to auto PAP with a range of 4 to 10 cm H2O. - Arrange face-to-face mask refitting with [facility] for more comfortable options.  Narcolepsy Narcolepsy causes daytime sleepiness and disrupted nighttime sleep. Modafinil  regimen in place. Open to additional nighttime medication. - Continue modafinil  200 mg in the morning and 100 mg in the afternoon as needed. - Prescribe zolpidem  5 mg, option to take one or two tablets before bedtime. - Educate on sleep hygiene practices.  Follow-up Requires ongoing management of sleep disorders and medication adjustments. - Schedule follow-up in six months unless issues arise sooner.    09/03/24- 28 yoF never smoker followed for Narcolepsy and OSA, Medical problem list includes Hyperlipidemia, PTSD,  - modafinil  200 mg AM/ 1/2 (100 mg) noon, Zoloft  Denies pregnancy.    CPAP auto 4-10 / [facility]    DreamStation 2 Auto machine  Says this machine 30-17 years old. Download compliance- Body weight today-211 lbs Had flu vax. Discussed the use of AI scribe software for clinical note transcription with the patient, who gave verbal consent to proceed.  History of Present Illness   PHARRAH ROTTMAN is a 28 year old female with narcolepsy and OSA who presents with issues related to her CPAP machine and home care services.  She experiences ongoing difficulties with her home care company, [facility], which she reports is unresponsive to her attempts to contact regarding her CPAP machine. She has left multiple voice messages and attempted to visit their office, often finding it closed. The company has limited staff availability, with only one staff person present on certain days.  Her current CPAP machine was received two to three years ago after  a recall on her previous machine. She has been trying to return the recalled machine, but confusion arose as her new CPAP was registered under her old machine, causing service continuity issues.  She takes modafinil , one whole tablet in the morning and half a tablet around noon, for narcolepsy.     Assessment and Plan:    Obstructive sleep apnea- benefits from CPAP Managed with CPAP therapy. Issues with home care company affected CPAP supply consistency. Current machine functioning well. Increased CPAP use beneficial. - Switch to a different home care company for CPAP supplies. - Patient care coordinators to assist with transition.  Narcolepsy Managed with modafinil . Current dosing effective. Not pregnant. Advised to ensure alertness before driving. Exploring driver's license due to outdated sleep study restrictions. - Refill modafinil  prescription. - Advise contacting DMV to initiate driver's license process, may require doctor's letter or form.    No cataplexy.  ROS-see HPI   + = positive Constitutional:    weight loss, night sweats, fevers, chills, fatigue, lassitude. HEENT:    headaches, difficulty swallowing, tooth/dental problems, sore throat,       sneezing, itching, ear ache, nasal congestion, post nasal drip, snoring CV:    chest pain, orthopnea, PND, swelling in lower extremities, anasarca,  dizziness, palpitations Resp:   shortness of breath with exertion or at rest.                productive cough,   non-productive cough, coughing up of blood.              change in color of mucus.  wheezing.   Skin:    rash or lesions. GI:  No-   heartburn, indigestion, abdominal pain, nausea, vomiting, diarrhea,                 change in bowel habits, loss of appetite GU: dysuria, change in color of urine, no urgency or frequency.   flank pain. MS:   joint pain, stiffness, decreased range of motion, back pain. Neuro-     nothing unusual Psych:  change in  mood or affect.  depression or anxiety.   memory loss.  OBJ- Physical Exam General- Alert, Oriented, Affect-appropriate, Distress- none acute,  Skin- rash-none, lesions- none, excoriation- none Lymphadenopathy- none Head- atraumatic            Eyes- Gross vision intact, PERRLA, conjunctivae and secretions clear            Ears- Hearing, canals-normal            Nose- Clear, no-Septal dev, mucus, polyps, erosion, perforation             Throat- Mallampati III , mucosa clear , drainage- none, tonsils+residual, +teeth Neck- flexible , trachea midline, no stridor , thyroid  nl, carotid no bruit Chest - symmetrical excursion , unlabored           Heart/CV- RRR , no murmur , no gallop  , no rub, nl s1 s2                           - JVD- none , edema- none, stasis changes- none, varices- none           Lung- clear to P&A, wheeze- none, cough- none , dullness-none, rub- none           Chest wall-  Abd-  Br/ Gen/ Rectal- Not done, not indicated Extrem- cyanosis- none, clubbing, none, atrophy- none, strength- nl Neuro- grossly intact to observation

## 2024-09-03 ENCOUNTER — Encounter: Payer: Self-pay | Admitting: Internal Medicine

## 2024-09-03 ENCOUNTER — Ambulatory Visit: Admitting: Internal Medicine

## 2024-09-03 ENCOUNTER — Other Ambulatory Visit (HOSPITAL_BASED_OUTPATIENT_CLINIC_OR_DEPARTMENT_OTHER): Payer: Self-pay

## 2024-09-03 ENCOUNTER — Other Ambulatory Visit: Payer: Self-pay

## 2024-09-03 VITALS — BP 122/70 | HR 84 | Temp 98.3°F | Ht 65.0 in | Wt 211.0 lb

## 2024-09-03 DIAGNOSIS — G4733 Obstructive sleep apnea (adult) (pediatric): Secondary | ICD-10-CM

## 2024-09-03 MED ORDER — MODAFINIL 200 MG PO TABS
ORAL_TABLET | ORAL | 5 refills | Status: AC
  Filled 2024-09-03: qty 60, 30d supply, fill #0
  Filled 2024-10-31 – 2024-11-27 (×2): qty 60, 30d supply, fill #1

## 2024-09-03 NOTE — Patient Instructions (Addendum)
 Modafinil  refilled  [facility] help change D[facility] for CPAP care from [facility] due to poor customer service, not available. Continue auto 4-10 (has Dream Station 2 machine) mask of choice, humidifier, supplies, SD card/ Download capability  Ok to ask the DME what is needed for you to drive.

## 2024-09-06 ENCOUNTER — Encounter: Payer: Self-pay | Admitting: Internal Medicine

## 2024-09-07 DIAGNOSIS — F4312 Post-traumatic stress disorder, chronic: Secondary | ICD-10-CM | POA: Diagnosis not present

## 2024-09-07 DIAGNOSIS — F902 Attention-deficit hyperactivity disorder, combined type: Secondary | ICD-10-CM | POA: Diagnosis not present

## 2024-09-07 DIAGNOSIS — F3281 Premenstrual dysphoric disorder: Secondary | ICD-10-CM | POA: Diagnosis not present

## 2024-09-21 DIAGNOSIS — F902 Attention-deficit hyperactivity disorder, combined type: Secondary | ICD-10-CM | POA: Diagnosis not present

## 2024-09-21 DIAGNOSIS — F4312 Post-traumatic stress disorder, chronic: Secondary | ICD-10-CM | POA: Diagnosis not present

## 2024-09-21 DIAGNOSIS — F3281 Premenstrual dysphoric disorder: Secondary | ICD-10-CM | POA: Diagnosis not present

## 2024-09-28 DIAGNOSIS — F3281 Premenstrual dysphoric disorder: Secondary | ICD-10-CM | POA: Diagnosis not present

## 2024-09-28 DIAGNOSIS — F4312 Post-traumatic stress disorder, chronic: Secondary | ICD-10-CM | POA: Diagnosis not present

## 2024-09-28 DIAGNOSIS — F902 Attention-deficit hyperactivity disorder, combined type: Secondary | ICD-10-CM | POA: Diagnosis not present

## 2024-10-05 ENCOUNTER — Encounter: Payer: Self-pay | Admitting: Internal Medicine

## 2024-10-05 DIAGNOSIS — F4312 Post-traumatic stress disorder, chronic: Secondary | ICD-10-CM | POA: Diagnosis not present

## 2024-10-05 DIAGNOSIS — F902 Attention-deficit hyperactivity disorder, combined type: Secondary | ICD-10-CM | POA: Diagnosis not present

## 2024-10-05 DIAGNOSIS — F3281 Premenstrual dysphoric disorder: Secondary | ICD-10-CM | POA: Diagnosis not present

## 2024-10-09 ENCOUNTER — Telehealth: Payer: Self-pay | Admitting: Internal Medicine

## 2024-10-09 NOTE — Telephone Encounter (Signed)
 Patient is dropping off DMV Medical forms in order for her to get her license. She needs for them to be faxed to the [facility] at [phone]. Forms will be placed in Dr. Saundra box for completion.

## 2024-10-14 ENCOUNTER — Other Ambulatory Visit (HOSPITAL_BASED_OUTPATIENT_CLINIC_OR_DEPARTMENT_OTHER): Payer: Self-pay

## 2024-10-14 DIAGNOSIS — F4312 Post-traumatic stress disorder, chronic: Secondary | ICD-10-CM | POA: Diagnosis not present

## 2024-10-14 DIAGNOSIS — F902 Attention-deficit hyperactivity disorder, combined type: Secondary | ICD-10-CM | POA: Diagnosis not present

## 2024-10-14 DIAGNOSIS — F3281 Premenstrual dysphoric disorder: Secondary | ICD-10-CM | POA: Diagnosis not present

## 2024-10-14 MED ORDER — SERTRALINE HCL 50 MG PO TABS
50.0000 mg | ORAL_TABLET | Freq: Every day | ORAL | 0 refills | Status: AC
  Filled 2024-10-14 – 2024-11-27 (×3): qty 90, 90d supply, fill #0

## 2024-10-16 DIAGNOSIS — F902 Attention-deficit hyperactivity disorder, combined type: Secondary | ICD-10-CM | POA: Diagnosis not present

## 2024-10-16 DIAGNOSIS — F4312 Post-traumatic stress disorder, chronic: Secondary | ICD-10-CM | POA: Diagnosis not present

## 2024-10-16 DIAGNOSIS — F3281 Premenstrual dysphoric disorder: Secondary | ICD-10-CM | POA: Diagnosis not present

## 2024-10-19 DIAGNOSIS — F4312 Post-traumatic stress disorder, chronic: Secondary | ICD-10-CM | POA: Diagnosis not present

## 2024-10-19 DIAGNOSIS — F902 Attention-deficit hyperactivity disorder, combined type: Secondary | ICD-10-CM | POA: Diagnosis not present

## 2024-10-19 DIAGNOSIS — F3281 Premenstrual dysphoric disorder: Secondary | ICD-10-CM | POA: Diagnosis not present

## 2024-10-22 NOTE — Telephone Encounter (Signed)
 Dorothy [name], Have  you seen this form?  What is the status of the form?

## 2024-10-22 NOTE — Telephone Encounter (Signed)
 Dr. Neysa completed his section of the forms and I faxed all forms to the fax number given on the sticky note by the patient.  DMV [facility]) [phone].  I will fax this again today.  I called the patient 10/20/2024 and told her the forms were completed and I would fax them to the number provided.

## 2024-10-26 DIAGNOSIS — F3281 Premenstrual dysphoric disorder: Secondary | ICD-10-CM | POA: Diagnosis not present

## 2024-10-26 DIAGNOSIS — F4312 Post-traumatic stress disorder, chronic: Secondary | ICD-10-CM | POA: Diagnosis not present

## 2024-10-26 DIAGNOSIS — F902 Attention-deficit hyperactivity disorder, combined type: Secondary | ICD-10-CM | POA: Diagnosis not present

## 2024-11-02 ENCOUNTER — Other Ambulatory Visit: Payer: Self-pay

## 2024-11-02 ENCOUNTER — Encounter: Payer: Self-pay | Admitting: Internal Medicine

## 2024-11-02 ENCOUNTER — Telehealth: Payer: Self-pay

## 2024-11-02 NOTE — Telephone Encounter (Signed)
 CPAP compliance report sent to be scanned into chart  When used, her CPAP from [facility] is leaving residual AHI about 8.5 events/ hour. She is only meeting compliance goal of usage at least 4 hours/ night on 29% of nights. DMV will likely want to see her using it more like 70% of nights (6 nights/ week).  I suggest she make a real effort to use it every night, anytime she sleeps. Send us  another download in 1 month, for a 30 day summary. Her new sleep doctor then may be able to send a letter to [facility] supporting her getting her driving license.

## 2024-11-02 NOTE — Telephone Encounter (Signed)
 Spoke with patient informed her we do not have an email to offer. But she can upload that form through her mychart    -NFN     Copied from CRM [id number]. Topic: General - Other >> Oct 08, 2024 12:05 PM Daniela A wrote: Reason for CRM: patient will be coming by the office to drop off a medical report form that provider needs to fill out in order for her to get her driver's license - she is also asking for it to be faxed to the [facility] once completed to fax# [phone] - she'll come by either today or tomorrow >> Nov 02, 2024  9:03 AM Russell PARAS wrote: Pt contacting clinic regarding a VM left by provider's nurse. She was advised to send a medical form for her driver's license to provider's email. However, she just received the necessary form and does not have the email address to send.   Requested call back   CB#  [phone], please leave email information in VM if she does not answer

## 2024-11-04 ENCOUNTER — Other Ambulatory Visit: Payer: Self-pay

## 2024-11-04 ENCOUNTER — Other Ambulatory Visit (HOSPITAL_COMMUNITY): Payer: Self-pay

## 2024-11-06 ENCOUNTER — Other Ambulatory Visit: Payer: Self-pay

## 2024-11-06 ENCOUNTER — Encounter: Payer: Self-pay | Admitting: Pharmacist

## 2024-11-10 ENCOUNTER — Other Ambulatory Visit: Payer: Self-pay

## 2024-11-23 ENCOUNTER — Other Ambulatory Visit (HOSPITAL_BASED_OUTPATIENT_CLINIC_OR_DEPARTMENT_OTHER): Payer: Self-pay

## 2024-11-27 ENCOUNTER — Other Ambulatory Visit: Payer: Self-pay

## 2024-11-27 ENCOUNTER — Other Ambulatory Visit (HOSPITAL_BASED_OUTPATIENT_CLINIC_OR_DEPARTMENT_OTHER): Payer: Self-pay

## 2024-12-10 ENCOUNTER — Other Ambulatory Visit (HOSPITAL_BASED_OUTPATIENT_CLINIC_OR_DEPARTMENT_OTHER): Payer: Self-pay

## 2024-12-15 ENCOUNTER — Encounter: Admitting: Pulmonary Disease
